# Patient Record
Sex: Female | Born: 1935 | Race: White | Hispanic: No | Marital: Married | Smoking: Never smoker
Health system: Southern US, Community
[De-identification: ages and names within clinical notes are randomized; demographics above are authoritative.]

## PROBLEM LIST (undated history)

## (undated) DIAGNOSIS — I10 Essential (primary) hypertension: Secondary | ICD-10-CM

## (undated) DIAGNOSIS — T7840XA Allergy, unspecified, initial encounter: Secondary | ICD-10-CM

## (undated) DIAGNOSIS — E119 Type 2 diabetes mellitus without complications: Secondary | ICD-10-CM

## (undated) HISTORY — DX: Essential (primary) hypertension: I10

## (undated) HISTORY — DX: Type 2 diabetes mellitus without complications: E11.9

## (undated) HISTORY — DX: Allergy, unspecified, initial encounter: T78.40XA

---

## 1998-03-12 ENCOUNTER — Other Ambulatory Visit: Admission: RE | Admit: 1998-03-12 | Discharge: 1998-03-12 | Payer: Self-pay | Admitting: Obstetrics and Gynecology

## 1999-04-24 ENCOUNTER — Other Ambulatory Visit: Admission: RE | Admit: 1999-04-24 | Discharge: 1999-04-24 | Payer: Self-pay | Admitting: Obstetrics and Gynecology

## 2000-05-18 ENCOUNTER — Other Ambulatory Visit: Admission: RE | Admit: 2000-05-18 | Discharge: 2000-05-18 | Payer: Self-pay | Admitting: Obstetrics and Gynecology

## 2000-10-19 ENCOUNTER — Encounter (INDEPENDENT_AMBULATORY_CARE_PROVIDER_SITE_OTHER): Payer: Self-pay

## 2000-10-19 ENCOUNTER — Ambulatory Visit (HOSPITAL_COMMUNITY): Admission: RE | Admit: 2000-10-19 | Discharge: 2000-10-19 | Payer: Self-pay | Admitting: Gastroenterology

## 2000-10-19 DIAGNOSIS — K5289 Other specified noninfective gastroenteritis and colitis: Secondary | ICD-10-CM

## 2000-10-19 DIAGNOSIS — K5730 Diverticulosis of large intestine without perforation or abscess without bleeding: Secondary | ICD-10-CM | POA: Insufficient documentation

## 2001-06-21 ENCOUNTER — Other Ambulatory Visit: Admission: RE | Admit: 2001-06-21 | Discharge: 2001-06-21 | Payer: Self-pay | Admitting: Obstetrics and Gynecology

## 2002-06-22 ENCOUNTER — Other Ambulatory Visit: Admission: RE | Admit: 2002-06-22 | Discharge: 2002-06-22 | Payer: Self-pay | Admitting: Internal Medicine

## 2004-07-03 ENCOUNTER — Other Ambulatory Visit: Admission: RE | Admit: 2004-07-03 | Discharge: 2004-07-03 | Payer: Self-pay | Admitting: Obstetrics and Gynecology

## 2005-02-12 ENCOUNTER — Ambulatory Visit: Payer: Self-pay | Admitting: Gastroenterology

## 2005-02-21 ENCOUNTER — Encounter (INDEPENDENT_AMBULATORY_CARE_PROVIDER_SITE_OTHER): Payer: Self-pay | Admitting: *Deleted

## 2005-02-21 ENCOUNTER — Ambulatory Visit: Payer: Self-pay | Admitting: Gastroenterology

## 2005-03-21 ENCOUNTER — Ambulatory Visit: Payer: Self-pay | Admitting: Gastroenterology

## 2007-01-05 ENCOUNTER — Ambulatory Visit: Payer: Self-pay | Admitting: Gastroenterology

## 2007-01-05 LAB — CONVERTED CEMR LAB
Basophils Relative: 0.8 % (ref 0.0–1.0)
Bilirubin, Direct: 0.1 mg/dL (ref 0.0–0.3)
CO2: 29 meq/L (ref 19–32)
Eosinophils Relative: 2.5 % (ref 0.0–5.0)
GFR calc Af Amer: 104 mL/min
Glucose, Bld: 116 mg/dL — ABNORMAL HIGH (ref 70–99)
Hemoglobin: 14 g/dL (ref 12.0–15.0)
Lymphocytes Relative: 34.7 % (ref 12.0–46.0)
MCV: 88.3 fL (ref 78.0–100.0)
Monocytes Absolute: 0.6 10*3/uL (ref 0.2–0.7)
Neutro Abs: 4.5 10*3/uL (ref 1.4–7.7)
Potassium: 3.7 meq/L (ref 3.5–5.1)
Sed Rate: 14 mm/hr (ref 0–25)
Sodium: 141 meq/L (ref 135–145)
TSH: 1.88 microintl units/mL (ref 0.35–5.50)
Total Protein: 6.8 g/dL (ref 6.0–8.3)
WBC: 8.3 10*3/uL (ref 4.5–10.5)

## 2007-01-06 ENCOUNTER — Ambulatory Visit: Payer: Self-pay | Admitting: Internal Medicine

## 2007-01-09 ENCOUNTER — Encounter: Admission: RE | Admit: 2007-01-09 | Discharge: 2007-01-09 | Payer: Self-pay | Admitting: Gastroenterology

## 2007-01-20 ENCOUNTER — Ambulatory Visit: Payer: Self-pay | Admitting: Gastroenterology

## 2007-10-04 DIAGNOSIS — E7800 Pure hypercholesterolemia, unspecified: Secondary | ICD-10-CM | POA: Insufficient documentation

## 2007-10-04 DIAGNOSIS — I10 Essential (primary) hypertension: Secondary | ICD-10-CM | POA: Insufficient documentation

## 2007-10-04 DIAGNOSIS — E039 Hypothyroidism, unspecified: Secondary | ICD-10-CM | POA: Insufficient documentation

## 2007-10-04 DIAGNOSIS — K219 Gastro-esophageal reflux disease without esophagitis: Secondary | ICD-10-CM | POA: Insufficient documentation

## 2007-10-04 DIAGNOSIS — I480 Paroxysmal atrial fibrillation: Secondary | ICD-10-CM

## 2007-10-04 DIAGNOSIS — T7840XA Allergy, unspecified, initial encounter: Secondary | ICD-10-CM | POA: Insufficient documentation

## 2008-07-09 ENCOUNTER — Emergency Department (HOSPITAL_COMMUNITY): Admission: EM | Admit: 2008-07-09 | Discharge: 2008-07-09 | Payer: Self-pay | Admitting: Family Medicine

## 2009-09-26 ENCOUNTER — Telehealth: Payer: Self-pay | Admitting: Gastroenterology

## 2010-07-23 NOTE — Progress Notes (Signed)
Summary: triage  Phone Note Call from Patient Call back at Home Phone [phone]   Caller: Patient Call For: Russella Dar Reason for Call: Talk to Nurse Summary of Call: Patient states that she is having problems with hemorroids wants to know if she needs a colon or what can she do. Initial call taken by: Tawni Levy,  September 26, 2009 9:45 AM  Follow-up for Phone Call        Patient  wanted Dr Russella Dar to operate on her bleeding hemorrhoids, I did explain to her that Dr Russella Dar is not a Careers adviser and that he doesn't perform that procedure.  Her primary care is going to refer her to [facility]. She also inquired about her next colon date.  According to the last colonoscopy from 02/2005 she is due for a repeat colon 02/2013.  Patient  will call back for further problems, questions or concerns. Follow-up by: Darcey Nora RN, CGRN,  September 26, 2009 10:00 AM

## 2010-11-05 NOTE — Assessment & Plan Note (Signed)
[facility]                         GASTROENTEROLOGY OFFICE NOTE   Tabitha Bryant                     MRN:          [id number]  DATE:01/05/2007                            DOB:          04/16/1928    Tabitha Bryant is a 77 year old white female patient of Dr. Gerlene Burdock Tisovec  and Dr. Russella Dar.  I was asked to see her today because of 3-4 days of  rather severe right lower quadrant pain radiating straight through to  her back with mild constipation, gas, and bloating.   Tabitha Bryant has had several colonoscopy exams by Dr. Russella Dar for recurrent  abdominal pain and guaiac positive stools.  The last exam was February 21, 2005, we show diverticulosis.  She had been doing well on a high  fiber diet except for some mild constipation.  Several days ago she had  the sudden development of right lower quadrant pain with nausea but no  vomiting, fever, or chills.  She had not taken any unusual medications  and there were no other sick family members at home.  She has been  taking magnesium citrate for constipation.  She denies any hepatobiliary  complaints and has had previous negative gallbladder ultrasound exam.   PAST MEDICAL HISTORY:  1. Hypothyroidism.  2. Hyperlipidemia.  3. Hypertension.  4. Chronic thyroid dysfunction.  5. She also in the past has a history of paroxysmal atrial      fibrillation and has recently been evaluated apparently by Dr.      Alanda Amass.   She is a nonsmoker and denies alcohol abuse.  SHE DOES HAVE A HISTORY OF  PENICILLIN AND CODEINE ALLERGIES.   PREVIOUS SURGICAL PROCEDURES:  1. Appendectomy.  2. Partial hysterectomy.   MEDICATIONS:  1. Synthroid 88 mcg a day.  2. HCTZ 12.5 mg three times a week.  3. Aspirin 81 mg a day.  4. Lanoxin 0.125 mg a day.  5. Altace 10 mg a day.  6. Lipitor 40 mg a day.   EXAMINATION:  She is a healthy, nontoxic appearing white female,  appearing her stated age in no acute distress.  She weighs  184 pounds  and blood pressure is 164/80, pulse was 70 and regular.  Could not  appreciate stigmata of chronic liver disease.  ABDOMINAL:  She had no organomegaly, masses, or distention.  There was  mild tenderness to direct palpation in the right lower quadrant without  rebound.  Bowel sounds were normal.  There was no psoas or obturator  signs present.  RECTAL:  Shows soft, profuse stool in the rectal vault that was guaiac  negative.  MENTAL STATUS:  Clear.   ASSESSMENT:  I think Tabitha Bryant most likely has diverticulitis and  right lower quadrant presentation is probably related to angulation of  her sigmoid colon.  She may have some adhesions from her prior pelvic  surgery.  It certainly does not sound like she has ischemic bowel  difficulties.   RECOMMENDATIONS:  1. Check CBC, sed rate, metabolic profile.  2. Abdominal - pelvic CT scan.  3. Start Cipro 500 mg twice a day along with  metronidazole 500 mg      twice a day for 10 days.  4. P.r.n. magnesium as needed for constipation.  5. Darvocet N-100 every 6-8 hours as needed for pain.  6. GI followup 2-3 week's time or sooner depending on x-rays and      clinical course.     Vania Rea. Jarold Motto, MD, Caleen Essex, FAGA  Electronically Signed    DRP/MedQ  DD: 01/05/2007  DT: 01/06/2007  Job #: [id number]   cc:   Venita Lick. Russella Dar, MD, Lauro Franklin, M.D.

## 2010-11-05 NOTE — Assessment & Plan Note (Signed)
[facility]                         GASTROENTEROLOGY OFFICE NOTE   Tabitha Bryant, Tabitha Bryant                     MRN:          [id number]  DATE:01/20/2007                            DOB:          Mar 25, 1928    This is a return office visit for right lower quadrant pain associated  with mild constipation, gas and bloating.  She was seen by Dr. Jarold Motto  on January 05, 2007.  I have reviewed his notes and the CT scan and blood  work obtained at that visit.  Due to the findings on CT scan, an  abdominal MRI with and without contrast was performed on January 09, 2007.  The hepatic lesions were both felt to be simple cysts.  Marked fatty  infiltration of the liver was noted.  L2-L3 central canal stenosis was  also noted.  She was treated with a course of Cipro and Flagyl for 10  days, as well as Milk of Magnesia for constipation and her symptoms  resolved.  There was no clear evidence on the CT scan or MRI of  diverticulitis.  She last underwent colonoscopy in September 2006 which  showed left colon diverticulosis and patchy erythema on the ileocecal  valve.  The ileocecal valve biopsy showed a nonspecific mild active  colitis.   CURRENT MEDICATIONS:  Listed on the chart, updated and reviewed.   MEDICATION ALLERGIES:  PENICILLIN and CODEINE.   PHYSICAL EXAMINATION:  Overweight white female in no acute distress.  Weight 182 pounds.  Blood pressure is 126/68, pulse 56 and regular.  CHEST:  Clear to auscultation bilaterally.  CARDIAC:  Regular rate and rhythm without murmurs.  ABDOMEN:  Soft, nontender, non-distended.  Normoactive bowel sounds.  No  palpable organomegaly, masses or hernias.  RECTAL:  Examination deferred; recent exam by Dr. Jarold Motto showed no  lesions and Hemoccult-negative stool.   ASSESSMENT AND PLAN:  Right lower quadrant associated with gas and  bloating.  Etiology of the symptoms are not clear, but are likely  secondary to constipation.   She may have had mild diverticulitis that  was not noted on imaging studies and has resolved with antibiotics.  She  is to maintain a long-term high-fiber diet with adequate fluid intake.  She may use a mild laxative such as Milk of Magnesia or MiraLax on an as-  needed basis.  If she has further symptoms, will consider colonoscopy  for further evaluation and to reevaluate the nonspecific mild colitis  noted in the right colon.     Venita Lick. Russella Dar, MD, [facility]  Electronically Signed    MTS/MedQ  DD: 01/21/2007  DT: 01/22/2007  Job #: [id number]   cc:   Gaspar Garbe, M.D.

## 2013-01-26 ENCOUNTER — Encounter: Payer: Self-pay | Admitting: Gastroenterology

## 2014-01-03 ENCOUNTER — Ambulatory Visit: Payer: Self-pay

## 2014-01-03 ENCOUNTER — Ambulatory Visit (INDEPENDENT_AMBULATORY_CARE_PROVIDER_SITE_OTHER): Payer: Medicare Other

## 2014-01-03 ENCOUNTER — Ambulatory Visit (INDEPENDENT_AMBULATORY_CARE_PROVIDER_SITE_OTHER): Payer: Medicare Other | Admitting: Podiatry

## 2014-01-03 ENCOUNTER — Encounter: Payer: Self-pay | Admitting: Podiatry

## 2014-01-03 VITALS — BP 159/76 | HR 78 | Resp 12

## 2014-01-03 DIAGNOSIS — R52 Pain, unspecified: Secondary | ICD-10-CM

## 2014-01-03 DIAGNOSIS — M722 Plantar fascial fibromatosis: Secondary | ICD-10-CM

## 2014-01-03 NOTE — Progress Notes (Signed)
   Subjective:    Patient ID: Tabitha Bryant, female    DOB: [birth year], 80 y.o.   MRN: [id number]  HPI  PT STATED LT FOOT HEEL IS BEEN HURTING FOR 2 WEEKS. THE HEEL IS GETTING WORSE, ESPECIALLY EARLY IN THE MORNING. THE FOOT GET AGGRAVATED BY PRESSURE AND EVERY TIME SHOT WITH  CORTISONE MAKING B/P INCREASE. TRIED TO SOAK WITH EPSON SALT BUT NO  HELP.    Review of Systems  HENT: Positive for sinus pressure.   Musculoskeletal: Positive for gait problem.  All other systems reviewed and are negative.      Objective:   Physical Exam: I have reviewed her past medical history medications allergies surgeries social history and review of systems. Pulses are strongly palpable bilateral. Neurologic sensorium is intact per Semmes-Weinstein monofilament. Deep tendon reflexes are intact bilateral muscle strength is 5 over 5 dorsiflexors plantar flexors inverters everters all intrinsic musculature is intact. Orthopedic evaluation demonstrates pain on palpation medial continued tubercle of the left heel. Radiographic evaluation confirms plantar distally oriented calcaneal heel spur with soft tissue increase in density at the plantar fascial calcaneal insertion site. Cutaneous evaluation demonstrates supple well hydrated cutis no erythema edema saline is drainage or odor.        Assessment & Plan:  Assessment: Plantar fasciitis left heel.  Plan: Injected left heel today with Kenalog and local anesthetic dispensed a night splint discussed appropriate shoe gear stretching exercises ice therapy and shoe gear modifications.

## 2014-01-18 ENCOUNTER — Ambulatory Visit: Payer: Self-pay | Admitting: Podiatry

## 2014-01-31 ENCOUNTER — Ambulatory Visit: Payer: Medicare Other | Admitting: Podiatry

## 2014-02-02 ENCOUNTER — Ambulatory Visit (INDEPENDENT_AMBULATORY_CARE_PROVIDER_SITE_OTHER): Payer: Medicare Other | Admitting: Podiatry

## 2014-02-02 ENCOUNTER — Encounter: Payer: Self-pay | Admitting: Podiatry

## 2014-02-02 VITALS — BP 108/87 | HR 80 | Resp 12

## 2014-02-02 DIAGNOSIS — M722 Plantar fascial fibromatosis: Secondary | ICD-10-CM

## 2014-02-03 NOTE — Progress Notes (Signed)
She presents today for followup of pain to her left heel. She states it hurts particularly which is walking on cement.  Objective: Vital signs are stable she is alert and oriented x3 she has no erythema edema cellulitis drainage or odor. Pulses are palpable to the left foot. No calf pain. She has pain on palpation medial continued tubercle of the left heel.  Assessment: Pain in limb secondary to plantar fasciitis of the left heel.  Plan: Injected left heel today at the patient's request and she had a plantar fascial strapping applied. I will followup with her in 3-4 weeks.

## 2014-08-22 ENCOUNTER — Encounter: Payer: Self-pay | Admitting: Podiatry

## 2014-08-22 ENCOUNTER — Ambulatory Visit (INDEPENDENT_AMBULATORY_CARE_PROVIDER_SITE_OTHER): Payer: Medicare Other | Admitting: Podiatry

## 2014-08-22 VITALS — BP 144/71 | HR 67 | Resp 16

## 2014-08-22 DIAGNOSIS — M722 Plantar fascial fibromatosis: Secondary | ICD-10-CM | POA: Diagnosis not present

## 2014-08-22 NOTE — Progress Notes (Signed)
She presents today for follow-up of her plantar fasciitis left. She states that the heel started hurting again.  Objective: Vital signs are stable she's alert and oriented 3. Pulses are palpable left foot. No calf pain. She has pain on palpation medial calcaneal tubercle of the left heel.  Assessment: Chronic intractable plantar fasciitis left heel.  Plan: Injected the left heel today with Kenalog and local anesthetic. Plan her any plantar fascial brace and will follow up with her in 1 month.

## 2014-09-05 ENCOUNTER — Encounter: Payer: Medicare Other | Admitting: Podiatry

## 2014-09-07 NOTE — Progress Notes (Signed)
This encounter was created in error - please disregard.

## 2014-09-26 ENCOUNTER — Encounter: Payer: Self-pay | Admitting: Podiatry

## 2014-09-26 ENCOUNTER — Ambulatory Visit (INDEPENDENT_AMBULATORY_CARE_PROVIDER_SITE_OTHER): Payer: Medicare Other | Admitting: Podiatry

## 2014-09-26 VITALS — BP 136/50 | HR 97 | Resp 16

## 2014-09-26 DIAGNOSIS — M76822 Posterior tibial tendinitis, left leg: Secondary | ICD-10-CM

## 2014-09-26 DIAGNOSIS — M722 Plantar fascial fibromatosis: Secondary | ICD-10-CM | POA: Diagnosis not present

## 2014-09-26 NOTE — Progress Notes (Signed)
She presents today for follow-up of her plantar fasciitis. She states that the heel seems to be better but it hurts in here patient points to the posterior tibial tendon of the left foot.  Objective: Pulses remain palpable and there are no calf pain. She has pain on inversion against resistance and pain on palpation of the posterior tibial tendon as it courses beneath the medial malleolus extending to the level of the navicular tuberosity. There is mild edema and mild fluctuance in the tendon sheath.  Assessment: Posterior tibial tendinitis probably associated with plantar fasciitis and lateral compensatory syndrome.  Plan: Injected the tendon sheath today with dexamethasone and local anesthetic she's going to continue all conservative therapies I will follow-up with her in 1 month at which time we'll discuss the possible need for orthotics.

## 2016-02-26 ENCOUNTER — Encounter (HOSPITAL_COMMUNITY): Admission: EM | Disposition: E | Payer: Self-pay | Source: Home / Self Care

## 2016-02-26 ENCOUNTER — Emergency Department (HOSPITAL_COMMUNITY): Payer: Medicare Other

## 2016-02-26 ENCOUNTER — Inpatient Hospital Stay (HOSPITAL_COMMUNITY)
Admission: EM | Admit: 2016-02-26 | Discharge: 2016-03-23 | DRG: 329 | Disposition: E | Payer: Medicare Other | Attending: General Surgery | Admitting: General Surgery

## 2016-02-26 ENCOUNTER — Inpatient Hospital Stay (HOSPITAL_COMMUNITY): Payer: Medicare Other

## 2016-02-26 ENCOUNTER — Inpatient Hospital Stay (HOSPITAL_COMMUNITY): Payer: Medicare Other | Admitting: Anesthesiology

## 2016-02-26 ENCOUNTER — Encounter (HOSPITAL_COMMUNITY): Payer: Self-pay | Admitting: Emergency Medicine

## 2016-02-26 DIAGNOSIS — K922 Gastrointestinal hemorrhage, unspecified: Secondary | ICD-10-CM

## 2016-02-26 DIAGNOSIS — E875 Hyperkalemia: Secondary | ICD-10-CM | POA: Diagnosis not present

## 2016-02-26 DIAGNOSIS — S3991XA Unspecified injury of abdomen, initial encounter: Secondary | ICD-10-CM

## 2016-02-26 DIAGNOSIS — E119 Type 2 diabetes mellitus without complications: Secondary | ICD-10-CM

## 2016-02-26 DIAGNOSIS — K659 Peritonitis, unspecified: Secondary | ICD-10-CM | POA: Diagnosis present

## 2016-02-26 DIAGNOSIS — S3981XA Other specified injuries of abdomen, initial encounter: Secondary | ICD-10-CM | POA: Diagnosis not present

## 2016-02-26 DIAGNOSIS — Z9049 Acquired absence of other specified parts of digestive tract: Secondary | ICD-10-CM

## 2016-02-26 DIAGNOSIS — I10 Essential (primary) hypertension: Secondary | ICD-10-CM | POA: Diagnosis present

## 2016-02-26 DIAGNOSIS — I480 Paroxysmal atrial fibrillation: Secondary | ICD-10-CM | POA: Diagnosis not present

## 2016-02-26 DIAGNOSIS — I959 Hypotension, unspecified: Secondary | ICD-10-CM | POA: Diagnosis present

## 2016-02-26 DIAGNOSIS — J90 Pleural effusion, not elsewhere classified: Secondary | ICD-10-CM | POA: Diagnosis present

## 2016-02-26 DIAGNOSIS — I6521 Occlusion and stenosis of right carotid artery: Secondary | ICD-10-CM | POA: Diagnosis present

## 2016-02-26 DIAGNOSIS — M6289 Other specified disorders of muscle: Secondary | ICD-10-CM | POA: Diagnosis not present

## 2016-02-26 DIAGNOSIS — Z66 Do not resuscitate: Secondary | ICD-10-CM | POA: Diagnosis present

## 2016-02-26 DIAGNOSIS — I639 Cerebral infarction, unspecified: Secondary | ICD-10-CM | POA: Diagnosis not present

## 2016-02-26 DIAGNOSIS — Y92410 Unspecified street and highway as the place of occurrence of the external cause: Secondary | ICD-10-CM

## 2016-02-26 DIAGNOSIS — I6350 Cerebral infarction due to unspecified occlusion or stenosis of unspecified cerebral artery: Secondary | ICD-10-CM | POA: Diagnosis not present

## 2016-02-26 DIAGNOSIS — I6302 Cerebral infarction due to thrombosis of basilar artery: Secondary | ICD-10-CM | POA: Diagnosis not present

## 2016-02-26 DIAGNOSIS — G9340 Encephalopathy, unspecified: Secondary | ICD-10-CM | POA: Diagnosis present

## 2016-02-26 DIAGNOSIS — R0602 Shortness of breath: Secondary | ICD-10-CM

## 2016-02-26 DIAGNOSIS — S42001A Fracture of unspecified part of right clavicle, initial encounter for closed fracture: Secondary | ICD-10-CM | POA: Diagnosis present

## 2016-02-26 DIAGNOSIS — E876 Hypokalemia: Secondary | ICD-10-CM | POA: Diagnosis present

## 2016-02-26 DIAGNOSIS — E872 Acidosis: Secondary | ICD-10-CM | POA: Diagnosis present

## 2016-02-26 DIAGNOSIS — K631 Perforation of intestine (nontraumatic): Secondary | ICD-10-CM | POA: Diagnosis present

## 2016-02-26 DIAGNOSIS — K259 Gastric ulcer, unspecified as acute or chronic, without hemorrhage or perforation: Secondary | ICD-10-CM

## 2016-02-26 DIAGNOSIS — D62 Acute posthemorrhagic anemia: Secondary | ICD-10-CM | POA: Diagnosis not present

## 2016-02-26 DIAGNOSIS — S52502A Unspecified fracture of the lower end of left radius, initial encounter for closed fracture: Secondary | ICD-10-CM | POA: Diagnosis present

## 2016-02-26 DIAGNOSIS — S299XXA Unspecified injury of thorax, initial encounter: Secondary | ICD-10-CM

## 2016-02-26 DIAGNOSIS — I6330 Cerebral infarction due to thrombosis of unspecified cerebral artery: Secondary | ICD-10-CM | POA: Diagnosis not present

## 2016-02-26 DIAGNOSIS — R7881 Bacteremia: Secondary | ICD-10-CM

## 2016-02-26 DIAGNOSIS — G8191 Hemiplegia, unspecified affecting right dominant side: Secondary | ICD-10-CM | POA: Diagnosis present

## 2016-02-26 DIAGNOSIS — R000 Tachycardia, unspecified: Secondary | ICD-10-CM | POA: Diagnosis present

## 2016-02-26 DIAGNOSIS — Z7982 Long term (current) use of aspirin: Secondary | ICD-10-CM

## 2016-02-26 DIAGNOSIS — I495 Sick sinus syndrome: Secondary | ICD-10-CM | POA: Diagnosis not present

## 2016-02-26 DIAGNOSIS — E669 Obesity, unspecified: Secondary | ICD-10-CM | POA: Diagnosis present

## 2016-02-26 DIAGNOSIS — S36892A Contusion of other intra-abdominal organs, initial encounter: Secondary | ICD-10-CM | POA: Diagnosis present

## 2016-02-26 DIAGNOSIS — E1165 Type 2 diabetes mellitus with hyperglycemia: Secondary | ICD-10-CM | POA: Diagnosis present

## 2016-02-26 DIAGNOSIS — G459 Transient cerebral ischemic attack, unspecified: Secondary | ICD-10-CM | POA: Diagnosis not present

## 2016-02-26 DIAGNOSIS — R4701 Aphasia: Secondary | ICD-10-CM | POA: Diagnosis present

## 2016-02-26 DIAGNOSIS — E785 Hyperlipidemia, unspecified: Secondary | ICD-10-CM | POA: Diagnosis present

## 2016-02-26 DIAGNOSIS — I6789 Other cerebrovascular disease: Secondary | ICD-10-CM | POA: Diagnosis not present

## 2016-02-26 DIAGNOSIS — E870 Hyperosmolality and hypernatremia: Secondary | ICD-10-CM | POA: Diagnosis present

## 2016-02-26 DIAGNOSIS — I63112 Cerebral infarction due to embolism of left vertebral artery: Secondary | ICD-10-CM | POA: Diagnosis not present

## 2016-02-26 DIAGNOSIS — R0682 Tachypnea, not elsewhere classified: Secondary | ICD-10-CM | POA: Diagnosis present

## 2016-02-26 DIAGNOSIS — J9601 Acute respiratory failure with hypoxia: Secondary | ICD-10-CM | POA: Diagnosis not present

## 2016-02-26 DIAGNOSIS — B9562 Methicillin resistant Staphylococcus aureus infection as the cause of diseases classified elsewhere: Secondary | ICD-10-CM | POA: Diagnosis present

## 2016-02-26 DIAGNOSIS — Z6830 Body mass index (BMI) 30.0-30.9, adult: Secondary | ICD-10-CM

## 2016-02-26 DIAGNOSIS — S298XXA Other specified injuries of thorax, initial encounter: Secondary | ICD-10-CM

## 2016-02-26 DIAGNOSIS — Z880 Allergy status to penicillin: Secondary | ICD-10-CM

## 2016-02-26 DIAGNOSIS — I63133 Cerebral infarction due to embolism of bilateral carotid arteries: Secondary | ICD-10-CM | POA: Diagnosis not present

## 2016-02-26 DIAGNOSIS — H518 Other specified disorders of binocular movement: Secondary | ICD-10-CM | POA: Diagnosis present

## 2016-02-26 DIAGNOSIS — S2231XA Fracture of one rib, right side, initial encounter for closed fracture: Secondary | ICD-10-CM | POA: Diagnosis not present

## 2016-02-26 DIAGNOSIS — J9600 Acute respiratory failure, unspecified whether with hypoxia or hypercapnia: Secondary | ICD-10-CM | POA: Diagnosis not present

## 2016-02-26 DIAGNOSIS — R001 Bradycardia, unspecified: Secondary | ICD-10-CM | POA: Diagnosis not present

## 2016-02-26 DIAGNOSIS — I4891 Unspecified atrial fibrillation: Secondary | ICD-10-CM | POA: Diagnosis not present

## 2016-02-26 DIAGNOSIS — S2239XA Fracture of one rib, unspecified side, initial encounter for closed fracture: Secondary | ICD-10-CM | POA: Diagnosis present

## 2016-02-26 DIAGNOSIS — S2242XA Multiple fractures of ribs, left side, initial encounter for closed fracture: Secondary | ICD-10-CM | POA: Diagnosis present

## 2016-02-26 DIAGNOSIS — J9811 Atelectasis: Secondary | ICD-10-CM

## 2016-02-26 DIAGNOSIS — N179 Acute kidney failure, unspecified: Secondary | ICD-10-CM | POA: Diagnosis not present

## 2016-02-26 DIAGNOSIS — J9690 Respiratory failure, unspecified, unspecified whether with hypoxia or hypercapnia: Secondary | ICD-10-CM

## 2016-02-26 DIAGNOSIS — I38 Endocarditis, valve unspecified: Secondary | ICD-10-CM | POA: Diagnosis present

## 2016-02-26 DIAGNOSIS — Z9289 Personal history of other medical treatment: Secondary | ICD-10-CM

## 2016-02-26 DIAGNOSIS — K668 Other specified disorders of peritoneum: Secondary | ICD-10-CM | POA: Diagnosis present

## 2016-02-26 DIAGNOSIS — S2249XA Multiple fractures of ribs, unspecified side, initial encounter for closed fracture: Secondary | ICD-10-CM | POA: Diagnosis present

## 2016-02-26 DIAGNOSIS — Z515 Encounter for palliative care: Secondary | ICD-10-CM | POA: Diagnosis present

## 2016-02-26 DIAGNOSIS — R4182 Altered mental status, unspecified: Secondary | ICD-10-CM

## 2016-02-26 HISTORY — PX: OMENTECTOMY: SHX5985

## 2016-02-26 HISTORY — PX: LAPAROTOMY: SHX154

## 2016-02-26 HISTORY — PX: BOWEL RESECTION: SHX1257

## 2016-02-26 LAB — BLOOD GAS, ARTERIAL
ACID-BASE DEFICIT: 9.2 mmol/L — AB (ref 0.0–2.0)
BICARBONATE: 16.5 mmol/L — AB (ref 20.0–28.0)
DRAWN BY: 44898
FIO2: 50
LHR: 16 {breaths}/min
MECHVT: 500 mL
O2 SAT: 97.9 %
PATIENT TEMPERATURE: 98.6
PCO2 ART: 37.5 mmHg (ref 32.0–48.0)
PEEP/CPAP: 5 cmH2O
PH ART: 7.265 — AB (ref 7.350–7.450)
PO2 ART: 116 mmHg — AB (ref 83.0–108.0)

## 2016-02-26 LAB — CBC
HCT: 42.5 % (ref 36.0–46.0)
HEMATOCRIT: 33.4 % — AB (ref 36.0–46.0)
HEMOGLOBIN: 11 g/dL — AB (ref 12.0–15.0)
Hemoglobin: 13.4 g/dL (ref 12.0–15.0)
MCH: 28.8 pg (ref 26.0–34.0)
MCH: 29.7 pg (ref 26.0–34.0)
MCHC: 31.5 g/dL (ref 30.0–36.0)
MCHC: 32.9 g/dL (ref 30.0–36.0)
MCV: 91.2 fL (ref 78.0–100.0)
MCV: 90.3 fL (ref 78.0–100.0)
PLATELETS: 311 10*3/uL (ref 150–400)
Platelets: 193 10*3/uL (ref 150–400)
RBC: 4.66 MIL/uL (ref 3.87–5.11)
RBC: 3.7 MIL/uL — ABNORMAL LOW (ref 3.87–5.11)
RDW: 14.5 % (ref 11.5–15.5)
RDW: 14.1 % (ref 11.5–15.5)
WBC: 27.7 10*3/uL — AB (ref 4.0–10.5)
WBC: 9.3 10*3/uL (ref 4.0–10.5)

## 2016-02-26 LAB — I-STAT CG4 LACTIC ACID, ED: Lactic Acid, Venous: 5.87 mmol/L (ref 0.5–1.9)

## 2016-02-26 LAB — URINALYSIS, ROUTINE W REFLEX MICROSCOPIC
BILIRUBIN URINE: NEGATIVE
Glucose, UA: 250 mg/dL — AB
KETONES UR: NEGATIVE mg/dL
Leukocytes, UA: NEGATIVE
NITRITE: NEGATIVE
PH: 7 (ref 5.0–8.0)
Protein, ur: 30 mg/dL — AB
Specific Gravity, Urine: 1.013 (ref 1.005–1.030)

## 2016-02-26 LAB — COMPREHENSIVE METABOLIC PANEL
ALK PHOS: 63 U/L (ref 38–126)
ALT: 99 U/L — AB (ref 14–54)
AST: 123 U/L — ABNORMAL HIGH (ref 15–41)
Albumin: 3.7 g/dL (ref 3.5–5.0)
Anion gap: 11 (ref 5–15)
BILIRUBIN TOTAL: 0.6 mg/dL (ref 0.3–1.2)
BUN: 11 mg/dL (ref 6–20)
CALCIUM: 9.7 mg/dL (ref 8.9–10.3)
CO2: 19 mmol/L — ABNORMAL LOW (ref 22–32)
CREATININE: 1.18 mg/dL — AB (ref 0.44–1.00)
Chloride: 106 mmol/L (ref 101–111)
GFR, EST AFRICAN AMERICAN: 47 mL/min — AB (ref >60)
GFR, EST NON AFRICAN AMERICAN: 41 mL/min — AB (ref >60)
Glucose, Bld: 285 mg/dL — ABNORMAL HIGH (ref 65–99)
Potassium: 4.2 mmol/L (ref 3.5–5.1)
Sodium: 136 mmol/L (ref 135–145)
TOTAL PROTEIN: 5.9 g/dL — AB (ref 6.5–8.1)

## 2016-02-26 LAB — BASIC METABOLIC PANEL
Anion gap: 7 (ref 5–15)
BUN: 14 mg/dL (ref 6–20)
CHLORIDE: 114 mmol/L — AB (ref 101–111)
CO2: 17 mmol/L — AB (ref 22–32)
Calcium: 7.4 mg/dL — ABNORMAL LOW (ref 8.9–10.3)
Creatinine, Ser: 1.25 mg/dL — ABNORMAL HIGH (ref 0.44–1.00)
GFR calc non Af Amer: 38 mL/min — ABNORMAL LOW (ref >60)
GFR, EST AFRICAN AMERICAN: 44 mL/min — AB (ref >60)
GLUCOSE: 288 mg/dL — AB (ref 65–99)
Potassium: 4 mmol/L (ref 3.5–5.1)
Sodium: 138 mmol/L (ref 135–145)

## 2016-02-26 LAB — I-STAT CHEM 8, ED
BUN: 13 mg/dL (ref 6–20)
CALCIUM ION: 1.22 mmol/L (ref 1.15–1.40)
Chloride: 105 mmol/L (ref 101–111)
Creatinine, Ser: 1.1 mg/dL — ABNORMAL HIGH (ref 0.44–1.00)
Glucose, Bld: 276 mg/dL — ABNORMAL HIGH (ref 65–99)
HCT: 42 % (ref 36.0–46.0)
Hemoglobin: 14.3 g/dL (ref 12.0–15.0)
Potassium: 4.2 mmol/L (ref 3.5–5.1)
Sodium: 139 mmol/L (ref 135–145)
TCO2: 22 mmol/L (ref 0–100)

## 2016-02-26 LAB — POCT I-STAT 7, (LYTES, BLD GAS, ICA,H+H)
Acid-base deficit: 9 mmol/L — ABNORMAL HIGH (ref 0.0–2.0)
BICARBONATE: 18.8 mmol/L — AB (ref 20.0–28.0)
Calcium, Ion: 1.02 mmol/L — ABNORMAL LOW (ref 1.15–1.40)
HEMATOCRIT: 26 % — AB (ref 36.0–46.0)
Hemoglobin: 8.8 g/dL — ABNORMAL LOW (ref 12.0–15.0)
O2 Saturation: 100 %
PCO2 ART: 44 mmHg (ref 32.0–48.0)
PO2 ART: 411 mmHg — AB (ref 83.0–108.0)
POTASSIUM: 4.3 mmol/L (ref 3.5–5.1)
Patient temperature: 35
Sodium: 139 mmol/L (ref 135–145)
TCO2: 20 mmol/L (ref 0–100)
pH, Arterial: 7.228 — ABNORMAL LOW (ref 7.350–7.450)

## 2016-02-26 LAB — PROTIME-INR
INR: 1.08
PROTHROMBIN TIME: 14 s (ref 11.4–15.2)

## 2016-02-26 LAB — URINE MICROSCOPIC-ADD ON

## 2016-02-26 LAB — ABO/RH: ABO/RH(D): AB POS

## 2016-02-26 LAB — CBG MONITORING, ED: Glucose-Capillary: 255 mg/dL — ABNORMAL HIGH (ref 65–99)

## 2016-02-26 LAB — PREPARE RBC (CROSSMATCH)

## 2016-02-26 LAB — TRIGLYCERIDES: Triglycerides: 206 mg/dL — ABNORMAL HIGH (ref <150)

## 2016-02-26 LAB — MRSA PCR SCREENING: MRSA BY PCR: NEGATIVE

## 2016-02-26 LAB — ETHANOL

## 2016-02-26 SURGERY — LAPAROTOMY, EXPLORATORY
Anesthesia: General | Site: Abdomen

## 2016-02-26 MED ORDER — SODIUM CHLORIDE 0.9 % IV SOLN: Freq: Once | INTRAVENOUS | Status: DC

## 2016-02-26 MED ORDER — MIDAZOLAM HCL 2 MG/2ML IJ SOLN
INTRAMUSCULAR | Status: AC
  Filled 2016-02-26: qty 2

## 2016-02-26 MED ORDER — MIDAZOLAM HCL 5 MG/5ML IJ SOLN
INTRAMUSCULAR | Status: DC | PRN
  Administered 2016-02-26: 2 mg via INTRAVENOUS

## 2016-02-26 MED ORDER — DEXTROSE 5 % IV SOLN
1.0000 g | Freq: Four times a day (QID) | INTRAVENOUS | Status: DC
  Administered 2016-02-26: 1 g via INTRAVENOUS
  Filled 2016-02-26: qty 1

## 2016-02-26 MED ORDER — SUCCINYLCHOLINE CHLORIDE 20 MG/ML IJ SOLN
INTRAMUSCULAR | Status: DC | PRN
  Administered 2016-02-26: 60 mg via INTRAVENOUS

## 2016-02-26 MED ORDER — FENTANYL CITRATE (PF) 100 MCG/2ML IJ SOLN
50.0000 ug | INTRAMUSCULAR | Status: DC | PRN
  Administered 2016-02-27 – 2016-02-28 (×7): 50 ug via INTRAVENOUS
  Filled 2016-02-26 (×8): qty 2

## 2016-02-26 MED ORDER — DIGOXIN 0.25 MG/ML IJ SOLN
0.1250 mg | Freq: Every day | INTRAMUSCULAR | Status: DC
  Administered 2016-02-28 – 2016-03-03 (×5): 0.125 mg via INTRAVENOUS
  Filled 2016-02-26 (×6): qty 2

## 2016-02-26 MED ORDER — LACTATED RINGERS IV SOLN
INTRAVENOUS | Status: DC | PRN
  Administered 2016-02-26: 14:00:00 via INTRAVENOUS

## 2016-02-26 MED ORDER — 0.9 % SODIUM CHLORIDE (POUR BTL) OPTIME
TOPICAL | Status: DC | PRN
  Administered 2016-02-26 (×3): 2000 mL

## 2016-02-26 MED ORDER — LEVOTHYROXINE SODIUM 100 MCG IV SOLR
50.0000 ug | Freq: Every day | INTRAVENOUS | Status: DC
  Administered 2016-02-27 – 2016-03-07 (×10): 50 ug via INTRAVENOUS
  Filled 2016-02-26 (×10): qty 5

## 2016-02-26 MED ORDER — FENTANYL CITRATE (PF) 100 MCG/2ML IJ SOLN: 50.0000 ug | INTRAMUSCULAR | Status: DC | PRN

## 2016-02-26 MED ORDER — PHENYLEPHRINE HCL 10 MG/ML IJ SOLN
0.0000 ug/min | INTRAVENOUS | Status: DC
  Filled 2016-02-26 (×3): qty 1

## 2016-02-26 MED ORDER — PROPOFOL 10 MG/ML IV BOLUS
INTRAVENOUS | Status: AC
  Filled 2016-02-26: qty 20

## 2016-02-26 MED ORDER — SODIUM CHLORIDE 0.9 % IV SOLN
INTRAVENOUS | Status: DC | PRN
  Administered 2016-02-26: 14:00:00 via INTRAVENOUS

## 2016-02-26 MED ORDER — ROCURONIUM BROMIDE 100 MG/10ML IV SOLN
INTRAVENOUS | Status: DC | PRN
  Administered 2016-02-26: 50 mg via INTRAVENOUS

## 2016-02-26 MED ORDER — ONDANSETRON HCL 4 MG/2ML IJ SOLN: 4.0000 mg | Freq: Four times a day (QID) | INTRAMUSCULAR | Status: DC | PRN

## 2016-02-26 MED ORDER — PHENYLEPHRINE HCL 10 MG/ML IJ SOLN
INTRAMUSCULAR | Status: DC | PRN
  Administered 2016-02-26 (×2): 80 ug via INTRAVENOUS
  Administered 2016-02-26: 40 ug via INTRAVENOUS

## 2016-02-26 MED ORDER — ONDANSETRON HCL 4 MG/2ML IJ SOLN
4.0000 mg | Freq: Once | INTRAMUSCULAR | Status: AC
  Administered 2016-02-26: 4 mg via INTRAVENOUS
  Filled 2016-02-26: qty 2

## 2016-02-26 MED ORDER — FENTANYL CITRATE (PF) 100 MCG/2ML IJ SOLN
INTRAMUSCULAR | Status: AC
  Filled 2016-02-26: qty 2

## 2016-02-26 MED ORDER — HYDROMORPHONE HCL 1 MG/ML IJ SOLN
0.5000 mg | Freq: Once | INTRAMUSCULAR | Status: DC
  Filled 2016-02-26: qty 1

## 2016-02-26 MED ORDER — FAMOTIDINE IN NACL 20-0.9 MG/50ML-% IV SOLN
20.0000 mg | INTRAVENOUS | Status: DC
  Administered 2016-02-27 – 2016-03-01 (×4): 20 mg via INTRAVENOUS
  Filled 2016-02-26 (×4): qty 50

## 2016-02-26 MED ORDER — PROPOFOL 10 MG/ML IV BOLUS
INTRAVENOUS | Status: DC | PRN
  Administered 2016-02-26: 100 mg via INTRAVENOUS

## 2016-02-26 MED ORDER — FENTANYL CITRATE (PF) 100 MCG/2ML IJ SOLN
50.0000 ug | Freq: Once | INTRAMUSCULAR | Status: AC
  Administered 2016-02-26: 50 ug via INTRAVENOUS
  Filled 2016-02-26: qty 2

## 2016-02-26 MED ORDER — KCL IN DEXTROSE-NACL 20-5-0.45 MEQ/L-%-% IV SOLN
INTRAVENOUS | Status: DC
  Administered 2016-02-26 – 2016-02-27 (×2): via INTRAVENOUS
  Filled 2016-02-26 (×3): qty 1000

## 2016-02-26 MED ORDER — FENTANYL CITRATE (PF) 100 MCG/2ML IJ SOLN
INTRAMUSCULAR | Status: DC | PRN
  Administered 2016-02-26 (×3): 100 ug via INTRAVENOUS

## 2016-02-26 MED ORDER — SODIUM CHLORIDE 0.9 % IV SOLN
INTRAVENOUS | Status: AC | PRN
  Administered 2016-02-26: 999 mL/h via INTRAVENOUS

## 2016-02-26 MED ORDER — SODIUM CHLORIDE 0.9 % IV BOLUS (SEPSIS)
2000.0000 mL | Freq: Once | INTRAVENOUS | Status: AC
  Administered 2016-02-26: 2000 mL via INTRAVENOUS

## 2016-02-26 MED ORDER — IOPAMIDOL (ISOVUE-300) INJECTION 61%
INTRAVENOUS | Status: AC
  Administered 2016-02-26: 75 mL
  Filled 2016-02-26: qty 75

## 2016-02-26 MED ORDER — ONDANSETRON HCL 4 MG PO TABS: 4.0000 mg | ORAL_TABLET | Freq: Four times a day (QID) | ORAL | Status: DC | PRN

## 2016-02-26 MED ORDER — SODIUM CHLORIDE 0.9 % IV BOLUS (SEPSIS): 500.0000 mL | Freq: Once | INTRAVENOUS | Status: DC

## 2016-02-26 MED ORDER — LIDOCAINE HCL (CARDIAC) 20 MG/ML IV SOLN
INTRAVENOUS | Status: DC | PRN
  Administered 2016-02-26: 50 mg via INTRAVENOUS

## 2016-02-26 MED ORDER — PHENYLEPHRINE HCL 10 MG/ML IJ SOLN
INTRAVENOUS | Status: DC | PRN
  Administered 2016-02-26: 50 ug/min via INTRAVENOUS

## 2016-02-26 MED ORDER — PROPOFOL 1000 MG/100ML IV EMUL
0.0000 ug/kg/min | INTRAVENOUS | Status: DC
  Administered 2016-02-26: 20 ug/kg/min via INTRAVENOUS
  Administered 2016-02-27: 23 ug/kg/min via INTRAVENOUS
  Administered 2016-02-27: 22 ug/kg/min via INTRAVENOUS
  Filled 2016-02-26: qty 100

## 2016-02-26 MED ORDER — LOTEPREDNOL ETABONATE 0.5 % OP SUSP
1.0000 [drp] | Freq: Four times a day (QID) | OPHTHALMIC | Status: DC
  Administered 2016-02-26 – 2016-03-07 (×40): 1 [drp] via OPHTHALMIC
  Filled 2016-02-26: qty 5

## 2016-02-26 SURGICAL SUPPLY — 45 items
BLADE SURG ROTATE 9660 (MISCELLANEOUS) IMPLANT
CANISTER SUCTION 2500CC (MISCELLANEOUS) ×4 IMPLANT
CHLORAPREP W/TINT 26ML (MISCELLANEOUS) ×4 IMPLANT
COVER SURGICAL LIGHT HANDLE (MISCELLANEOUS) ×8 IMPLANT
DRAPE LAPAROSCOPIC ABDOMINAL (DRAPES) ×4 IMPLANT
DRAPE WARM FLUID 44X44 (DRAPE) ×4 IMPLANT
DRSG OPSITE POSTOP 4X10 (GAUZE/BANDAGES/DRESSINGS) IMPLANT
DRSG OPSITE POSTOP 4X8 (GAUZE/BANDAGES/DRESSINGS) IMPLANT
DRSG PAD ABDOMINAL 8X10 ST (GAUZE/BANDAGES/DRESSINGS) ×4 IMPLANT
ELECT BLADE 6.5 EXT (BLADE) ×4 IMPLANT
ELECT CAUTERY BLADE 6.4 (BLADE) ×4 IMPLANT
ELECT REM PT RETURN 9FT ADLT (ELECTROSURGICAL) ×4
ELECTRODE REM PT RTRN 9FT ADLT (ELECTROSURGICAL) ×2 IMPLANT
GLOVE BIO SURGEON STRL SZ8 (GLOVE) ×4 IMPLANT
GLOVE BIOGEL PI IND STRL 8 (GLOVE) ×2 IMPLANT
GLOVE BIOGEL PI INDICATOR 8 (GLOVE) ×2
GOWN STRL REUS W/ TWL LRG LVL3 (GOWN DISPOSABLE) ×6 IMPLANT
GOWN STRL REUS W/ TWL XL LVL3 (GOWN DISPOSABLE) ×4 IMPLANT
GOWN STRL REUS W/TWL LRG LVL3 (GOWN DISPOSABLE) ×6
GOWN STRL REUS W/TWL XL LVL3 (GOWN DISPOSABLE) ×4
HANDLE SUCTION POOLE (INSTRUMENTS) ×2 IMPLANT
KIT BASIN OR (CUSTOM PROCEDURE TRAY) ×4 IMPLANT
KIT ROOM TURNOVER OR (KITS) ×4 IMPLANT
LIGASURE IMPACT 36 18CM CVD LR (INSTRUMENTS) ×4 IMPLANT
NS IRRIG 1000ML POUR BTL (IV SOLUTION) ×8 IMPLANT
PACK GENERAL/GYN (CUSTOM PROCEDURE TRAY) ×4 IMPLANT
PAD ARMBOARD 7.5X6 YLW CONV (MISCELLANEOUS) ×4 IMPLANT
RELOAD PROXIMATE 75MM BLUE (ENDOMECHANICALS) ×20 IMPLANT
SPECIMEN JAR LARGE (MISCELLANEOUS) IMPLANT
SPONGE GAUZE 4X4 12PLY STER LF (GAUZE/BANDAGES/DRESSINGS) ×4 IMPLANT
SPONGE LAP 18X18 X RAY DECT (DISPOSABLE) ×8 IMPLANT
STAPLER GUN LINEAR PROX 60 (STAPLE) ×4 IMPLANT
STAPLER PROXIMATE 75MM BLUE (STAPLE) ×8 IMPLANT
STAPLER VISISTAT 35W (STAPLE) IMPLANT
SUCTION POOLE HANDLE (INSTRUMENTS) ×4
SUCTION POOLE TIP (SUCTIONS) ×4 IMPLANT
SUT PDS AB 1 TP1 96 (SUTURE) ×8 IMPLANT
SUT SILK 2 0 SH CR/8 (SUTURE) ×8 IMPLANT
SUT SILK 2 0 TIES 10X30 (SUTURE) ×4 IMPLANT
SUT SILK 3 0 SH CR/8 (SUTURE) ×4 IMPLANT
SUT SILK 3 0 TIES 10X30 (SUTURE) ×4 IMPLANT
TAPE CLOTH SURG 4X10 WHT LF (GAUZE/BANDAGES/DRESSINGS) ×4 IMPLANT
TOWEL OR 17X26 10 PK STRL BLUE (TOWEL DISPOSABLE) ×8 IMPLANT
TRAY FOLEY CATH 16FRSI W/METER (SET/KITS/TRAYS/PACK) ×4 IMPLANT
YANKAUER SUCT BULB TIP NO VENT (SUCTIONS) ×4 IMPLANT
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
## 2016-02-26 NOTE — Progress Notes (Signed)
Orthopedic Tech Progress Note Patient Details:  Birdie HopesVertie P Gully 14-Aug-1927 [id number]  Ortho Devices Type of Ortho Device: Ace wrap, Volar splint Ortho Device/Splint Location: LUE Ortho Device/Splint Interventions: Ordered, Application   Jennye MoccasinHughes, Allona Gondek Craig 03/17/2016, 7:48 PM

## 2016-02-26 NOTE — Progress Notes (Signed)
Dr. Luisa Hartornett notified of hypotension. Order to transfuse two units of PRBC, give two liters of NS, and neo to maintain systolic BP>100. Orders carried out. Will continue to monitor closely.

## 2016-02-26 NOTE — OR Nursing (Addendum)
C-Collar off at 1355 and re-applied after intubation by Dr. Maple HudsonMoser/ reported by T. SinclairRN  3100 NOTIFIED OF CLOSING @1500  (1ST CALL)

## 2016-02-26 NOTE — Anesthesia Procedure Notes (Signed)
Procedure Name: Intubation Date/Time: 03/07/2016 1:59 PM Performed by: Darcey NoraJAMES, Eldine Rencher B Pre-anesthesia Checklist: Patient identified, Emergency Drugs available, Suction available and Patient being monitored Patient Re-evaluated:Patient Re-evaluated prior to inductionOxygen Delivery Method: Circle system utilized Preoxygenation: Pre-oxygenation with 100% oxygen Intubation Type: IV induction Ventilation: Mask ventilation without difficulty Laryngoscope Size: Mac and 3 (Glidescope) Grade View: Grade II Tube type: Subglottic suction tube Tube size: 7.5 mm Number of attempts: 2 (KBJ unable to pass ; Dr. Maple HudsonMoser accomplished) Airway Equipment and Method: Stylet and Video-laryngoscopy Placement Confirmation: ETT inserted through vocal cords under direct vision,  positive ETCO2 and breath sounds checked- equal and bilateral Secured at: 22 (cm at teeth) cm Tube secured with: Tape Dental Injury: Teeth and Oropharynx as per pre-operative assessment
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
## 2016-02-26 NOTE — ED Notes (Signed)
Per Verdie MosherLiu, MD pt has increased HR, decreasing BP & + FAST, pt to be Activated as a Level 1
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
## 2016-02-26 NOTE — ED Notes (Signed)
Family at beside. Family given emotional support. 

## 2016-02-26 NOTE — Transfer of Care (Signed)
Immediate Anesthesia Transfer of Care Note  Patient: Tabitha Bryant  Procedure(s) Performed: Procedure(s): EXPLORATORY LAPAROTOMY (N/A) SMALL BOWEL RESECTION TIMES 2 OMENTECTOMY (N/A)  Patient Location: NICU  Anesthesia Type:General  Level of Consciousness: sedated and Patient remains intubated per anesthesia plan  Airway & Oxygen Therapy: Patient remains intubated per anesthesia plan and Patient placed on Ventilator (see vital sign flow sheet for setting)  Post-op Assessment: Report given to RN and Post -op Vital signs reviewed and stable  Post vital signs: Reviewed and stable  Last Vitals:  Vitals:   03/04/2016 1326 03/15/2016 1550  BP: 110/64   Pulse:  80  Resp:  14  Temp:      Last Pain:  Vitals:   03/20/2016 1330  TempSrc:   PainSc: 10-Worst pain ever         Complications: No apparent anesthesia complications
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
## 2016-02-26 NOTE — Anesthesia Postprocedure Evaluation (Signed)
Anesthesia Post Note  Patient: Tabitha Bryant  Procedure(s) Performed: Procedure(s) (LRB): EXPLORATORY LAPAROTOMY (N/A) SMALL BOWEL RESECTION TIMES 2 OMENTECTOMY (N/A)  Patient location during evaluation: ICU Anesthesia Type: General Level of consciousness: sedated Pain management: pain level controlled Vital Signs Assessment: post-procedure vital signs reviewed and stable Respiratory status: patient on ventilator - see flowsheet for VS and patient remains intubated per anesthesia plan Cardiovascular status: stable Anesthetic complications: no    Last Vitals:  Vitals:   03/03/2016 1326 03/20/2016 1550  BP: 110/64   Pulse:  80  Resp:  14  Temp:      Last Pain:  Vitals:   03/04/2016 1330  TempSrc:   PainSc: 10-Worst pain ever                 Kwanza Cancelliere

## 2016-02-26 NOTE — Anesthesia Procedure Notes (Signed)
Central Venous Catheter Insertion Performed by: anesthesiologist Patient location: Pre-op. Preanesthetic checklist: patient identified, IV checked, site marked, risks and benefits discussed, surgical consent, monitors and equipment checked, pre-op evaluation, timeout performed and anesthesia consent Position: supine Landmarks identified Catheter size: 9 Fr Sheath introducer Procedure performed without using ultrasound guided technique. Attempts: 1 Following insertion, line sutured, dressing applied and Biopatch. Post procedure assessment: blood return through all ports, free fluid flow and no air. Patient tolerated the procedure well with no immediate complications.

## 2016-02-26 NOTE — Progress Notes (Signed)
Pt transported to and from CT without incident ?
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
## 2016-02-26 NOTE — Anesthesia Preprocedure Evaluation (Signed)
Anesthesia Evaluation  Patient identified by MRN, date of birth, ID band Patient awake    Reviewed: Unable to perform ROS - Chart review onlyPreop documentation limited or incomplete due to emergent nature of procedure.  History of Anesthesia Complications Negative for: history of anesthetic complications  Airway Mallampati: IV   Neck ROM: Limited   Comment: c collar Dental  (+) Teeth Intact   Pulmonary    + rhonchi        Cardiovascular hypertension, Pt. on medications  Rhythm:Regular Rate:Tachycardia     Neuro/Psych    GI/Hepatic GERD  ,  Endo/Other  diabetes, Type 2Hypothyroidism   Renal/GU      Musculoskeletal   Abdominal   Peds  Hematology   Anesthesia Other Findings   Reproductive/Obstetrics                             Anesthesia Physical Anesthesia Plan  ASA: IV and emergent  Anesthesia Plan: General   Post-op Pain Management:    Induction: Intravenous, Rapid sequence and Cricoid pressure planned  Airway Management Planned: Oral ETT  Additional Equipment: CVP and Arterial line  Intra-op Plan:   Post-operative Plan: Post-operative intubation/ventilation  Informed Consent:   History available from chart only and Only emergency history available  Plan Discussed with: CRNA and Surgeon  Anesthesia Plan Comments:         Anesthesia Quick Evaluation

## 2016-02-27 ENCOUNTER — Encounter (HOSPITAL_COMMUNITY): Payer: Self-pay | Admitting: General Surgery

## 2016-02-27 ENCOUNTER — Inpatient Hospital Stay (HOSPITAL_COMMUNITY): Payer: Medicare Other

## 2016-02-27 LAB — POCT I-STAT 3, ART BLOOD GAS (G3+)
ACID-BASE DEFICIT: 10 mmol/L — AB (ref 0.0–2.0)
Bicarbonate: 15.1 mmol/L — ABNORMAL LOW (ref 20.0–28.0)
O2 SAT: 92 %
PCO2 ART: 31.8 mmHg — AB (ref 32.0–48.0)
PH ART: 7.285 — AB (ref 7.350–7.450)
Patient temperature: 98.4
TCO2: 16 mmol/L (ref 0–100)
pO2, Arterial: 69 mmHg — ABNORMAL LOW (ref 83.0–108.0)

## 2016-02-27 LAB — GLUCOSE, CAPILLARY
GLUCOSE-CAPILLARY: 195 mg/dL — AB (ref 65–99)
GLUCOSE-CAPILLARY: 224 mg/dL — AB (ref 65–99)
GLUCOSE-CAPILLARY: 171 mg/dL — AB (ref 65–99)
Glucose-Capillary: 312 mg/dL — ABNORMAL HIGH (ref 65–99)
Glucose-Capillary: 355 mg/dL — ABNORMAL HIGH (ref 65–99)
Glucose-Capillary: 241 mg/dL — ABNORMAL HIGH (ref 65–99)

## 2016-02-27 LAB — CBC
HCT: 46.9 % — ABNORMAL HIGH (ref 36.0–46.0)
HEMOGLOBIN: 15.9 g/dL — AB (ref 12.0–15.0)
MCH: 30.2 pg (ref 26.0–34.0)
MCHC: 33.9 g/dL (ref 30.0–36.0)
MCV: 89.2 fL (ref 78.0–100.0)
PLATELETS: 166 10*3/uL (ref 150–400)
RBC: 5.26 MIL/uL — AB (ref 3.87–5.11)
RDW: 14.4 % (ref 11.5–15.5)
WBC: 15.3 10*3/uL — ABNORMAL HIGH (ref 4.0–10.5)

## 2016-02-27 LAB — BASIC METABOLIC PANEL
ANION GAP: 18 — AB (ref 5–15)
Anion gap: 9 (ref 5–15)
BUN: 21 mg/dL — AB (ref 6–20)
BUN: 27 mg/dL — AB (ref 6–20)
CALCIUM: 7.3 mg/dL — AB (ref 8.9–10.3)
CHLORIDE: 105 mmol/L (ref 101–111)
CO2: 12 mmol/L — AB (ref 22–32)
CO2: 17 mmol/L — AB (ref 22–32)
CREATININE: 1.86 mg/dL — AB (ref 0.44–1.00)
Calcium: 7.3 mg/dL — ABNORMAL LOW (ref 8.9–10.3)
Chloride: 112 mmol/L — ABNORMAL HIGH (ref 101–111)
Creatinine, Ser: 1.55 mg/dL — ABNORMAL HIGH (ref 0.44–1.00)
GFR calc Af Amer: 34 mL/min — ABNORMAL LOW (ref >60)
GFR calc Af Amer: 27 mL/min — ABNORMAL LOW (ref >60)
GFR, EST NON AFRICAN AMERICAN: 29 mL/min — AB (ref >60)
GFR, EST NON AFRICAN AMERICAN: 23 mL/min — AB (ref >60)
GLUCOSE: 341 mg/dL — AB (ref 65–99)
GLUCOSE: 278 mg/dL — AB (ref 65–99)
POTASSIUM: 6.1 mmol/L — AB (ref 3.5–5.1)
Potassium: 5.9 mmol/L — ABNORMAL HIGH (ref 3.5–5.1)
Sodium: 135 mmol/L (ref 135–145)
Sodium: 138 mmol/L (ref 135–145)

## 2016-02-27 LAB — BLOOD GAS, ARTERIAL
ACID-BASE DEFICIT: 12.9 mmol/L — AB (ref 0.0–2.0)
BICARBONATE: 13.5 mmol/L — AB (ref 20.0–28.0)
DRAWN BY: 44166
FIO2: 0.4
MECHVT: 500 mL
O2 SAT: 93.5 %
PATIENT TEMPERATURE: 98.4
PCO2 ART: 34.9 mmHg (ref 32.0–48.0)
PEEP/CPAP: 5 cmH2O
PH ART: 7.21 — AB (ref 7.350–7.450)
RATE: 16 resp/min
pO2, Arterial: 74.4 mmHg — ABNORMAL LOW (ref 83.0–108.0)

## 2016-02-27 LAB — POTASSIUM: Potassium: 4.7 mmol/L (ref 3.5–5.1)

## 2016-02-27 LAB — CDS SEROLOGY

## 2016-02-27 LAB — PROTIME-INR
INR: 1.27
Prothrombin Time: 16 seconds — ABNORMAL HIGH (ref 11.4–15.2)

## 2016-02-27 LAB — BLOOD PRODUCT ORDER (VERBAL) VERIFICATION

## 2016-02-27 MED ORDER — SODIUM CHLORIDE 0.45 % IV SOLN
INTRAVENOUS | Status: DC
  Filled 2016-02-27 (×2): qty 1000

## 2016-02-27 MED ORDER — SODIUM CHLORIDE 0.45 % IV SOLN
INTRAVENOUS | Status: DC
  Administered 2016-02-27 – 2016-02-29 (×6): via INTRAVENOUS
  Administered 2016-02-29: 1000 mL via INTRAVENOUS

## 2016-02-27 MED ORDER — SODIUM POLYSTYRENE SULFONATE 15 GM/60ML PO SUSP
15.0000 g | Freq: Once | ORAL | Status: AC
  Administered 2016-02-27: 15 g via RECTAL
  Filled 2016-02-27: qty 60

## 2016-02-27 MED ORDER — SODIUM BICARBONATE 8.4 % IV SOLN
INTRAVENOUS | Status: AC
  Administered 2016-02-27: 100 meq via INTRAVENOUS
  Filled 2016-02-27: qty 100

## 2016-02-27 MED ORDER — ENOXAPARIN SODIUM 30 MG/0.3ML ~~LOC~~ SOLN: 30.0000 mg | SUBCUTANEOUS | Status: DC

## 2016-02-27 MED ORDER — INSULIN ASPART 100 UNIT/ML ~~LOC~~ SOLN
0.0000 [IU] | SUBCUTANEOUS | Status: DC
  Administered 2016-02-27: 11 [IU] via SUBCUTANEOUS
  Administered 2016-02-27: 15 [IU] via SUBCUTANEOUS

## 2016-02-27 MED ORDER — ALBUMIN HUMAN 5 % IV SOLN
25.0000 g | Freq: Once | INTRAVENOUS | Status: AC
  Administered 2016-02-27: 25 g via INTRAVENOUS
  Filled 2016-02-27: qty 500

## 2016-02-27 MED ORDER — INSULIN ASPART 100 UNIT/ML ~~LOC~~ SOLN
0.0000 [IU] | SUBCUTANEOUS | Status: DC
  Administered 2016-02-27 (×2): 7 [IU] via SUBCUTANEOUS
  Administered 2016-02-28: 4 [IU] via SUBCUTANEOUS
  Administered 2016-02-28: 7 [IU] via SUBCUTANEOUS
  Administered 2016-02-28: 3 [IU] via SUBCUTANEOUS
  Administered 2016-02-28 (×4): 4 [IU] via SUBCUTANEOUS
  Administered 2016-02-29: 3 [IU] via SUBCUTANEOUS
  Administered 2016-02-29: 4 [IU] via SUBCUTANEOUS
  Administered 2016-02-29 (×2): 3 [IU] via SUBCUTANEOUS
  Administered 2016-02-29: 4 [IU] via SUBCUTANEOUS
  Administered 2016-03-01 (×5): 3 [IU] via SUBCUTANEOUS
  Administered 2016-03-02 – 2016-03-03 (×9): 4 [IU] via SUBCUTANEOUS
  Administered 2016-03-03 (×3): 3 [IU] via SUBCUTANEOUS
  Administered 2016-03-04 (×4): 4 [IU] via SUBCUTANEOUS
  Administered 2016-03-05: 7 [IU] via SUBCUTANEOUS
  Administered 2016-03-05: 3 [IU] via SUBCUTANEOUS
  Administered 2016-03-05 (×4): 4 [IU] via SUBCUTANEOUS
  Administered 2016-03-06: 3 [IU] via SUBCUTANEOUS
  Administered 2016-03-06: 7 [IU] via SUBCUTANEOUS
  Administered 2016-03-06: 4 [IU] via SUBCUTANEOUS
  Administered 2016-03-06: 3 [IU] via SUBCUTANEOUS
  Administered 2016-03-06: 4 [IU] via SUBCUTANEOUS
  Administered 2016-03-06: 7 [IU] via SUBCUTANEOUS
  Administered 2016-03-06 – 2016-03-08 (×4): 3 [IU] via SUBCUTANEOUS

## 2016-02-27 MED ORDER — CALCIUM CHLORIDE 10 % IV SOLN
1.0000 g | Freq: Once | INTRAVENOUS | Status: AC
  Administered 2016-02-27: 1 g via INTRAVENOUS
  Filled 2016-02-27: qty 10

## 2016-02-27 MED ORDER — ENOXAPARIN SODIUM 30 MG/0.3ML ~~LOC~~ SOLN: 30.0000 mg | Freq: Two times a day (BID) | SUBCUTANEOUS | Status: DC

## 2016-02-27 MED ORDER — FUROSEMIDE 10 MG/ML IJ SOLN
20.0000 mg | Freq: Once | INTRAMUSCULAR | Status: AC
  Administered 2016-02-27: 20 mg via INTRAVENOUS

## 2016-02-27 MED ORDER — SODIUM BICARBONATE 8.4 % IV SOLN
100.0000 meq | Freq: Once | INTRAVENOUS | Status: AC
  Administered 2016-02-27: 100 meq via INTRAVENOUS

## 2016-02-27 MED ORDER — ENOXAPARIN SODIUM 30 MG/0.3ML ~~LOC~~ SOLN
30.0000 mg | Freq: Two times a day (BID) | SUBCUTANEOUS | Status: DC
  Administered 2016-02-27 – 2016-03-08 (×20): 30 mg via SUBCUTANEOUS
  Filled 2016-02-27 (×20): qty 0.3

## 2016-02-27 MED ORDER — INSULIN ASPART 100 UNIT/ML IV SOLN
10.0000 [IU] | Freq: Once | INTRAVENOUS | Status: AC
  Administered 2016-02-27: 10 [IU] via INTRAVENOUS

## 2016-02-27 MED ORDER — DEXTROSE 50 % IV SOLN
1.0000 | Freq: Once | INTRAVENOUS | Status: AC
  Administered 2016-02-27: 50 mL via INTRAVENOUS
  Filled 2016-02-27: qty 50

## 2016-02-27 MED ORDER — DEXTROSE-NACL 5-0.45 % IV SOLN: INTRAVENOUS | Status: DC

## 2016-02-27 MED ORDER — FUROSEMIDE 10 MG/ML IJ SOLN
INTRAMUSCULAR | Status: AC
  Administered 2016-02-27: 20 mg via INTRAVENOUS
  Filled 2016-02-27: qty 2

## 2016-02-27 MED ORDER — PROPOFOL 1000 MG/100ML IV EMUL
0.0000 ug/kg/min | INTRAVENOUS | Status: DC
  Administered 2016-02-27: 45 ug/kg/min via INTRAVENOUS
  Administered 2016-02-28: 30 ug/kg/min via INTRAVENOUS
  Filled 2016-02-27 (×4): qty 100

## 2016-02-27 MED ORDER — CHLORHEXIDINE GLUCONATE 0.12 % MT SOLN
15.0000 mL | Freq: Two times a day (BID) | OROMUCOSAL | Status: DC
  Administered 2016-02-27 – 2016-03-07 (×20): 15 mL via OROMUCOSAL
  Filled 2016-02-27 (×5): qty 15

## 2016-02-27 MED ORDER — ORAL CARE MOUTH RINSE
15.0000 mL | OROMUCOSAL | Status: DC
  Administered 2016-02-27 – 2016-03-07 (×84): 15 mL via OROMUCOSAL

## 2016-02-27 MED ORDER — SODIUM CHLORIDE 0.9 % IV SOLN
500.0000 mL | Freq: Once | INTRAVENOUS | Status: AC
  Administered 2016-02-27: 500 mL via INTRAVENOUS

## 2016-02-27 MED ORDER — POTASSIUM CHLORIDE 2 MEQ/ML IV SOLN
INTRAVENOUS | Status: DC
  Filled 2016-02-27 (×2): qty 1000

## 2016-02-27 NOTE — Progress Notes (Signed)
Inpatient Diabetes Program Recommendations  AACE/ADA: New Consensus Statement on Inpatient Glycemic Control (2015)  Target Ranges:  Prepandial:   less than 140 mg/dL      Peak postprandial:   less than 180 mg/dL (1-2 hours)      Critically ill patients:  140 - 180 mg/dL   Lab Results  Component Value Date   GLUCAP 241 (H) 02/27/2016    Review of Glycemic Control  Blood sugars uncontrolled at present.  Inpatient Diabetes Program Recommendations:   ICU Glycemic Control Protocol  Thank you. Tabitha Bryant, RD, LDN, CDE Inpatient Diabetes Coordinator [phone]

## 2016-02-27 NOTE — Progress Notes (Signed)
MD made aware of low urine output. Order for 500 cc bolus of NS. Order carried out. Will continue to monitor closely.

## 2016-02-27 NOTE — Progress Notes (Signed)
MD notified of 8 beats of SVT. Order to correct hyperkalemia with insulin and dextrose. Orders carried out. Will continue to monitor closely.
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
## 2016-02-27 NOTE — Progress Notes (Signed)
Subjective: Pt still intubated and sedated.  No significant events overnight.   Objective: Vital signs in last 24 hours: Temp:  [97.4 F (36.3 C)-98.4 F (36.9 C)] 97.9 F (36.6 C) (09/06 0800) Pulse Rate:  [80-128] 104 (09/06 0852) Resp:  [14-31] 25 (09/06 0852) BP: (86-183)/(51-90) 100/74 (09/06 0852) SpO2:  [92 %-100 %] 94 % (09/06 0852) Arterial Line BP: (97-176)/(49-69) 140/68 (09/06 0800) FiO2 (%):  [40 %-100 %] 40 % (09/06 0852) Weight:  [72.6 kg (160 lb)-82.4 kg (181 lb 10.5 oz)] 82.4 kg (181 lb 10.5 oz) (09/06 0600)  Intake/Output from previous day: 09/05 0701 - 09/06 0700 In: 8295.66691.3 [I.V.:4344.3; Blood:2207; NG/GT:90; IV Piggyback:50] Out: 690 [Urine:510; Emesis/NG output:30; Blood:150] Intake/Output this shift: Total I/O In: 109.6 [I.V.:109.6] Out: -    Recent Labs  2016-01-20 1317 2016-01-20 1329 2016-01-20 1432 2016-01-20 1630 02/27/16 0412  HGB 13.4 14.3 8.8* 11.0* 15.9*    Recent Labs  2016-01-20 1630 02/27/16 0412  WBC 9.3 15.3*  RBC 3.70* 5.26*  HCT 33.4* 46.9*  PLT 193 166    Recent Labs  2016-01-20 1630 02/27/16 0412  NA 138 135  K 4.0 6.1*  CL 114* 105  CO2 17* 12*  BUN 14 21*  CREATININE 1.25* 1.55*  GLUCOSE 288* 341*  CALCIUM 7.4* 7.3*    Recent Labs  2016-01-20 1317  INR 1.08    PE:  L UE splint intact.  R clavicle with slight swelling.  Assessment/Plan: Continue NWB On B UEs.  Will follow.   Tabitha Bryant, Tabitha Bryant 02/27/2016, 9:09 AM
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
## 2016-02-28 ENCOUNTER — Inpatient Hospital Stay (HOSPITAL_COMMUNITY): Payer: Medicare Other

## 2016-02-28 ENCOUNTER — Encounter (HOSPITAL_COMMUNITY): Payer: Self-pay | Admitting: Radiology

## 2016-02-28 DIAGNOSIS — Z9049 Acquired absence of other specified parts of digestive tract: Secondary | ICD-10-CM

## 2016-02-28 DIAGNOSIS — M6289 Other specified disorders of muscle: Secondary | ICD-10-CM

## 2016-02-28 DIAGNOSIS — G459 Transient cerebral ischemic attack, unspecified: Secondary | ICD-10-CM

## 2016-02-28 LAB — TYPE AND SCREEN
ABO/RH(D): AB POS
Antibody Screen: NEGATIVE
UNIT DIVISION: 0
UNIT DIVISION: 0
UNIT DIVISION: 0
UNIT DIVISION: 0
Unit division: 0
Unit division: 0

## 2016-02-28 LAB — PREPARE FRESH FROZEN PLASMA
UNIT DIVISION: 0
UNIT DIVISION: 0
Unit division: 0
Unit division: 0

## 2016-02-28 LAB — CBC
HCT: 36.7 % (ref 36.0–46.0)
HEMOGLOBIN: 12.5 g/dL (ref 12.0–15.0)
MCH: 29.8 pg (ref 26.0–34.0)
MCHC: 34.1 g/dL (ref 30.0–36.0)
MCV: 87.6 fL (ref 78.0–100.0)
PLATELETS: 121 10*3/uL — AB (ref 150–400)
RBC: 4.19 MIL/uL (ref 3.87–5.11)
RDW: 15.4 % (ref 11.5–15.5)
WBC: 15.5 10*3/uL — AB (ref 4.0–10.5)

## 2016-02-28 LAB — GLUCOSE, CAPILLARY
GLUCOSE-CAPILLARY: 179 mg/dL — AB (ref 65–99)
GLUCOSE-CAPILLARY: 168 mg/dL — AB (ref 65–99)
GLUCOSE-CAPILLARY: 153 mg/dL — AB (ref 65–99)
Glucose-Capillary: 152 mg/dL — ABNORMAL HIGH (ref 65–99)
Glucose-Capillary: 206 mg/dL — ABNORMAL HIGH (ref 65–99)
Glucose-Capillary: 146 mg/dL — ABNORMAL HIGH (ref 65–99)

## 2016-02-28 LAB — BASIC METABOLIC PANEL
ANION GAP: 8 (ref 5–15)
BUN: 38 mg/dL — ABNORMAL HIGH (ref 6–20)
CALCIUM: 7.9 mg/dL — AB (ref 8.9–10.3)
CHLORIDE: 111 mmol/L (ref 101–111)
CO2: 19 mmol/L — AB (ref 22–32)
CREATININE: 1.48 mg/dL — AB (ref 0.44–1.00)
GFR calc non Af Amer: 31 mL/min — ABNORMAL LOW (ref >60)
GFR, EST AFRICAN AMERICAN: 36 mL/min — AB (ref >60)
Glucose, Bld: 168 mg/dL — ABNORMAL HIGH (ref 65–99)
Potassium: 4.5 mmol/L (ref 3.5–5.1)
SODIUM: 138 mmol/L (ref 135–145)

## 2016-02-28 MED ORDER — IOPAMIDOL (ISOVUE-370) INJECTION 76%
INTRAVENOUS | Status: AC
  Administered 2016-02-28: 50 mL
  Filled 2016-02-28: qty 50

## 2016-02-28 MED ORDER — SODIUM CHLORIDE 0.9 % IV SOLN
25.0000 ug/h | INTRAVENOUS | Status: DC
  Administered 2016-02-28: 25 ug/h via INTRAVENOUS
  Administered 2016-02-29: 150 ug/h via INTRAVENOUS
  Filled 2016-02-28 (×3): qty 50

## 2016-02-28 MED ORDER — FENTANYL CITRATE (PF) 100 MCG/2ML IJ SOLN
50.0000 ug | Freq: Once | INTRAMUSCULAR | Status: AC
  Administered 2016-02-28: 50 ug via INTRAVENOUS

## 2016-02-28 MED ORDER — FENTANYL BOLUS VIA INFUSION
25.0000 ug | INTRAVENOUS | Status: DC | PRN
  Administered 2016-03-01 – 2016-03-02 (×5): 25 ug via INTRAVENOUS
  Filled 2016-02-28: qty 25

## 2016-02-28 MED ORDER — FUROSEMIDE 10 MG/ML IJ SOLN
20.0000 mg | Freq: Once | INTRAMUSCULAR | Status: AC
  Administered 2016-02-28: 20 mg via INTRAVENOUS
  Filled 2016-02-28: qty 2

## 2016-02-28 MED ORDER — METOPROLOL TARTRATE 5 MG/5ML IV SOLN
5.0000 mg | Freq: Four times a day (QID) | INTRAVENOUS | Status: DC | PRN
  Administered 2016-02-29 – 2016-03-07 (×8): 5 mg via INTRAVENOUS
  Filled 2016-02-28 (×9): qty 5
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
## 2016-02-28 NOTE — Progress Notes (Signed)
Not moving rt side (even to pain) at shift change check. Qustionable  Leftward gaze. MD Kinsinger notified. CT ordered, Rapid response called Code stroke called.
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
## 2016-02-28 NOTE — Progress Notes (Signed)
In CT with Dr Roseanne RenoStewart and rapid response team. Will get CTA also

## 2016-02-28 NOTE — Progress Notes (Signed)
Spoke with Dr. Roseanne RenoStewart. CT on phone, of note, pt now will w/draw rt from pain.
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
## 2016-02-29 ENCOUNTER — Inpatient Hospital Stay (HOSPITAL_COMMUNITY): Payer: Medicare Other

## 2016-02-29 DIAGNOSIS — E875 Hyperkalemia: Secondary | ICD-10-CM | POA: Diagnosis not present

## 2016-02-29 DIAGNOSIS — S42001A Fracture of unspecified part of right clavicle, initial encounter for closed fracture: Secondary | ICD-10-CM | POA: Diagnosis present

## 2016-02-29 DIAGNOSIS — S2239XA Fracture of one rib, unspecified side, initial encounter for closed fracture: Secondary | ICD-10-CM | POA: Diagnosis present

## 2016-02-29 DIAGNOSIS — I639 Cerebral infarction, unspecified: Secondary | ICD-10-CM

## 2016-02-29 DIAGNOSIS — S2249XA Multiple fractures of ribs, unspecified side, initial encounter for closed fracture: Secondary | ICD-10-CM | POA: Diagnosis present

## 2016-02-29 DIAGNOSIS — E119 Type 2 diabetes mellitus without complications: Secondary | ICD-10-CM

## 2016-02-29 DIAGNOSIS — N179 Acute kidney failure, unspecified: Secondary | ICD-10-CM | POA: Diagnosis not present

## 2016-02-29 DIAGNOSIS — S52502A Unspecified fracture of the lower end of left radius, initial encounter for closed fracture: Secondary | ICD-10-CM | POA: Diagnosis present

## 2016-02-29 DIAGNOSIS — I6350 Cerebral infarction due to unspecified occlusion or stenosis of unspecified cerebral artery: Secondary | ICD-10-CM

## 2016-02-29 DIAGNOSIS — D62 Acute posthemorrhagic anemia: Secondary | ICD-10-CM | POA: Diagnosis not present

## 2016-02-29 DIAGNOSIS — I6330 Cerebral infarction due to thrombosis of unspecified cerebral artery: Secondary | ICD-10-CM

## 2016-02-29 DIAGNOSIS — J9600 Acute respiratory failure, unspecified whether with hypoxia or hypercapnia: Secondary | ICD-10-CM | POA: Diagnosis present

## 2016-02-29 LAB — CBC
HCT: 30.9 % — ABNORMAL LOW (ref 36.0–46.0)
HEMOGLOBIN: 10.1 g/dL — AB (ref 12.0–15.0)
MCH: 28.9 pg (ref 26.0–34.0)
MCHC: 32.7 g/dL (ref 30.0–36.0)
MCV: 88.5 fL (ref 78.0–100.0)
Platelets: 109 10*3/uL — ABNORMAL LOW (ref 150–400)
RBC: 3.49 MIL/uL — AB (ref 3.87–5.11)
RDW: 15.6 % — ABNORMAL HIGH (ref 11.5–15.5)
WBC: 8.7 10*3/uL (ref 4.0–10.5)

## 2016-02-29 LAB — GLUCOSE, CAPILLARY
GLUCOSE-CAPILLARY: 150 mg/dL — AB (ref 65–99)
GLUCOSE-CAPILLARY: 161 mg/dL — AB (ref 65–99)
Glucose-Capillary: 130 mg/dL — ABNORMAL HIGH (ref 65–99)
Glucose-Capillary: 146 mg/dL — ABNORMAL HIGH (ref 65–99)
Glucose-Capillary: 147 mg/dL — ABNORMAL HIGH (ref 65–99)
Glucose-Capillary: 174 mg/dL — ABNORMAL HIGH (ref 65–99)

## 2016-02-29 LAB — BASIC METABOLIC PANEL
Anion gap: 8 (ref 5–15)
BUN: 45 mg/dL — ABNORMAL HIGH (ref 6–20)
CO2: 20 mmol/L — ABNORMAL LOW (ref 22–32)
Calcium: 7.4 mg/dL — ABNORMAL LOW (ref 8.9–10.3)
Chloride: 108 mmol/L (ref 101–111)
Creatinine, Ser: 1.27 mg/dL — ABNORMAL HIGH (ref 0.44–1.00)
GFR calc Af Amer: 43 mL/min — ABNORMAL LOW (ref >60)
GFR calc non Af Amer: 37 mL/min — ABNORMAL LOW (ref >60)
Glucose, Bld: 154 mg/dL — ABNORMAL HIGH (ref 65–99)
Potassium: 3.7 mmol/L (ref 3.5–5.1)
Sodium: 136 mmol/L (ref 135–145)

## 2016-02-29 LAB — AMMONIA: Ammonia: 36 umol/L — ABNORMAL HIGH (ref 9–35)

## 2016-02-29 MED ORDER — FUROSEMIDE 10 MG/ML IJ SOLN
INTRAMUSCULAR | Status: AC
Start: 2016-02-29 — End: 2016-02-29
  Filled 2016-02-29: qty 2

## 2016-02-29 MED ORDER — SODIUM CHLORIDE 0.9 % IV SOLN
INTRAVENOUS | Status: DC
  Administered 2016-02-29: 23:00:00 via INTRAVENOUS

## 2016-02-29 MED ORDER — ASPIRIN 300 MG RE SUPP
300.0000 mg | Freq: Every day | RECTAL | Status: DC
  Administered 2016-02-29 – 2016-03-04 (×5): 300 mg via RECTAL
  Filled 2016-02-29 (×6): qty 1

## 2016-02-29 MED ORDER — FUROSEMIDE 10 MG/ML IJ SOLN
40.0000 mg | Freq: Two times a day (BID) | INTRAMUSCULAR | Status: DC
  Administered 2016-02-29 – 2016-03-01 (×4): 40 mg via INTRAVENOUS
  Filled 2016-02-29 (×4): qty 4

## 2016-02-29 MED ORDER — FUROSEMIDE 10 MG/ML IJ SOLN
20.0000 mg | Freq: Once | INTRAMUSCULAR | Status: AC
  Administered 2016-02-29: 20 mg via INTRAVENOUS

## 2016-02-29 MED ORDER — SODIUM CHLORIDE 0.9 % IV SOLN
1000.0000 mg | Freq: Once | INTRAVENOUS | Status: AC
  Administered 2016-02-29: 1000 mg via INTRAVENOUS
  Filled 2016-02-29: qty 10

## 2016-02-29 MED ORDER — SODIUM CHLORIDE 0.9 % IV SOLN
500.0000 mg | Freq: Two times a day (BID) | INTRAVENOUS | Status: DC
  Administered 2016-02-29 – 2016-03-01 (×3): 500 mg via INTRAVENOUS
  Filled 2016-02-29 (×5): qty 5

## 2016-02-29 MED ORDER — FENTANYL CITRATE (PF) 100 MCG/2ML IJ SOLN
25.0000 ug | INTRAMUSCULAR | Status: DC | PRN
  Administered 2016-02-29 – 2016-03-07 (×26): 25 ug via INTRAVENOUS
  Filled 2016-02-29 (×26): qty 2
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
## 2016-02-29 NOTE — Progress Notes (Addendum)
Pt to MRI monitored by 2 RN at all times. After 2 sequences, EKG showing artifact so RN asked scan to be stopped to check patient, Pt appeared to be tachypenic, in mild distress, despite O2 sat being 93. MRI was aborted. In MRI hall, pts regulart monitor replaced on patient and her HR was 127 and her RR was 30. , pt was returned to room immediately.In transit, sats decreased to 85, Met by MD and RT and other RN staff in the room, O2 changed to 100% NRB. See orders, pt sats, vitals returned to normal, work of breathing improved but still needs close monitoring. Pt's sons updated by MD and RN in the room.   Addendum: Above occurred between roughly 1600-1630  

## 2016-02-29 NOTE — Progress Notes (Signed)
RN called d/t pt became SOB, increased WOB while in MRI. Pt was returned to ICU.  Found pt already on NRB.  BBS[facility] coarse t/o- rhonchi/coarse crackles.  Dr Janee Mornhompson at bedside, RN gave lasix.  NTS performed x 2 for small/mod thick white secretions.  RR 26, sat 100% on NRB, mild increased WOB noted.  Per MD, continue to monitor.  Pt appears slightly improved.  PRN NTS and bipap order if needed.  Per MD, hold off in bipap for now.

## 2016-02-29 NOTE — Progress Notes (Signed)
Patient ID: Tabitha HopesVertie P Bryant, female   DOB: 1927/08/12, 82 y.o.   MRN: [id number] MR terminated due to increased WOB. Brought back to ICU. On FM O2 with sats in upper 90s. RR 28 with some increased WOB still. NTS done and lasix given with good result. She is doing a little better now. Will see how it goes. I spoke with her son. She may worsen and require BiPAP or intubation. I let him know. Violeta GelinasBurke Keaden Gunnoe, MD, MPH, FACS Trauma: [phone] General Surgery: [phone]

## 2016-02-29 NOTE — Progress Notes (Signed)
EEG completed; results pending.    
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
## 2016-02-29 NOTE — Procedures (Signed)
Extubation Procedure Note  Patient Details:   Name: Tabitha Bryant DOB: Mar 28, 1928 MRN: [id number]   Airway Documentation:     Evaluation  O2 sats: stable throughout Complications: No apparent complications Patient did tolerate procedure well. Bilateral Breath Sounds: Clear w/ snoring respirations.  No distress noted, pt moving good air.  No stridor noted.   No, pt does not speak d/t neuro status currently. MD at bedside to eval pt.  No new RT orders received currently.    Jennette KettleBrowning, Jaeven Wanzer Joy 02/29/2016, 11:07 AM
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
## 2016-02-29 NOTE — Care Management Important Message (Signed)
Important Message  Patient Details  Name: Tabitha Bryant MRN: [id number] Date of Birth: 05/10/1928   Medicare Important Message Given:  Other (see comment)    Praise Stennett Abena 02/29/2016, 11:30 AM

## 2016-03-01 ENCOUNTER — Inpatient Hospital Stay (HOSPITAL_COMMUNITY): Payer: Medicare Other

## 2016-03-01 ENCOUNTER — Inpatient Hospital Stay (HOSPITAL_COMMUNITY): Payer: Medicare Other | Admitting: Anesthesiology

## 2016-03-01 DIAGNOSIS — N179 Acute kidney failure, unspecified: Secondary | ICD-10-CM

## 2016-03-01 DIAGNOSIS — I6330 Cerebral infarction due to thrombosis of unspecified cerebral artery: Secondary | ICD-10-CM

## 2016-03-01 DIAGNOSIS — J9601 Acute respiratory failure with hypoxia: Secondary | ICD-10-CM

## 2016-03-01 DIAGNOSIS — D62 Acute posthemorrhagic anemia: Secondary | ICD-10-CM

## 2016-03-01 DIAGNOSIS — J90 Pleural effusion, not elsewhere classified: Secondary | ICD-10-CM

## 2016-03-01 DIAGNOSIS — E785 Hyperlipidemia, unspecified: Secondary | ICD-10-CM

## 2016-03-01 LAB — GLUCOSE, CAPILLARY
Glucose-Capillary: 139 mg/dL — ABNORMAL HIGH (ref 65–99)
Glucose-Capillary: 119 mg/dL — ABNORMAL HIGH (ref 65–99)
Glucose-Capillary: 145 mg/dL — ABNORMAL HIGH (ref 65–99)
Glucose-Capillary: 134 mg/dL — ABNORMAL HIGH (ref 65–99)

## 2016-03-01 LAB — BASIC METABOLIC PANEL
ANION GAP: 8 (ref 5–15)
BUN: 46 mg/dL — ABNORMAL HIGH (ref 6–20)
CALCIUM: 7.4 mg/dL — AB (ref 8.9–10.3)
CHLORIDE: 109 mmol/L (ref 101–111)
CO2: 24 mmol/L (ref 22–32)
Creatinine, Ser: 1.09 mg/dL — ABNORMAL HIGH (ref 0.44–1.00)
GFR calc non Af Amer: 45 mL/min — ABNORMAL LOW (ref >60)
GFR, EST AFRICAN AMERICAN: 52 mL/min — AB (ref >60)
Glucose, Bld: 144 mg/dL — ABNORMAL HIGH (ref 65–99)
POTASSIUM: 3.4 mmol/L — AB (ref 3.5–5.1)
Sodium: 141 mmol/L (ref 135–145)

## 2016-03-01 LAB — BLOOD GAS, ARTERIAL
ACID-BASE DEFICIT: 2.8 mmol/L — AB (ref 0.0–2.0)
Acid-Base Excess: 0.8 mmol/L (ref 0.0–2.0)
BICARBONATE: 22.8 mmol/L (ref 20.0–28.0)
Bicarbonate: 25.1 mmol/L (ref 20.0–28.0)
DRAWN BY: 46203
Drawn by: 398981
FIO2: 100
FIO2: 44
O2 SAT: 98.6 %
O2 SAT: 92.6 %
PATIENT TEMPERATURE: 98.6
PCO2 ART: 48.5 mmHg — AB (ref 32.0–48.0)
PH ART: 7.293 — AB (ref 7.350–7.450)
PO2 ART: 135 mmHg — AB (ref 83.0–108.0)
Patient temperature: 98.6
pCO2 arterial: 42.1 mmHg (ref 32.0–48.0)
pH, Arterial: 7.393 (ref 7.350–7.450)
pO2, Arterial: 62.8 mmHg — ABNORMAL LOW (ref 83.0–108.0)

## 2016-03-01 LAB — LIPID PANEL
CHOL/HDL RATIO: 15.3 ratio
Cholesterol: 138 mg/dL (ref 0–200)
HDL: 9 mg/dL — AB (ref >40)
LDL CALC: UNDETERMINED mg/dL (ref 0–99)
Triglycerides: 511 mg/dL — ABNORMAL HIGH (ref <150)
VLDL: UNDETERMINED mg/dL (ref 0–40)

## 2016-03-01 LAB — CBC
HCT: 33 % — ABNORMAL LOW (ref 36.0–46.0)
HEMATOCRIT: 31.1 % — AB (ref 36.0–46.0)
HEMOGLOBIN: 10.2 g/dL — AB (ref 12.0–15.0)
HEMOGLOBIN: 10.8 g/dL — AB (ref 12.0–15.0)
MCH: 29.4 pg (ref 26.0–34.0)
MCH: 29.5 pg (ref 26.0–34.0)
MCHC: 32.8 g/dL (ref 30.0–36.0)
MCHC: 32.7 g/dL (ref 30.0–36.0)
MCV: 89.6 fL (ref 78.0–100.0)
MCV: 90.2 fL (ref 78.0–100.0)
PLATELETS: 189 10*3/uL (ref 150–400)
Platelets: 145 10*3/uL — ABNORMAL LOW (ref 150–400)
RBC: 3.47 MIL/uL — AB (ref 3.87–5.11)
RBC: 3.66 MIL/uL — AB (ref 3.87–5.11)
RDW: 16 % — AB (ref 11.5–15.5)
RDW: 16 % — ABNORMAL HIGH (ref 11.5–15.5)
WBC: 11.3 10*3/uL — AB (ref 4.0–10.5)
WBC: 12.3 10*3/uL — ABNORMAL HIGH (ref 4.0–10.5)

## 2016-03-01 LAB — POTASSIUM: POTASSIUM: 2.2 mmol/L — AB (ref 3.5–5.1)

## 2016-03-01 LAB — MAGNESIUM: MAGNESIUM: 1.4 mg/dL — AB (ref 1.7–2.4)

## 2016-03-01 MED ORDER — METOPROLOL TARTRATE 5 MG/5ML IV SOLN
5.0000 mg | INTRAVENOUS | Status: AC
  Administered 2016-03-01: 5 mg via INTRAVENOUS

## 2016-03-01 MED ORDER — ETOMIDATE 2 MG/ML IV SOLN
INTRAVENOUS | Status: DC | PRN
  Administered 2016-03-01: 8 mg via INTRAVENOUS

## 2016-03-01 MED ORDER — POTASSIUM CHLORIDE 10 MEQ/50ML IV SOLN
10.0000 meq | INTRAVENOUS | Status: AC
  Administered 2016-03-02 (×6): 10 meq via INTRAVENOUS
  Filled 2016-03-01 (×6): qty 50

## 2016-03-01 MED ORDER — FAMOTIDINE IN NACL 20-0.9 MG/50ML-% IV SOLN
20.0000 mg | Freq: Two times a day (BID) | INTRAVENOUS | Status: DC
  Administered 2016-03-02 – 2016-03-04 (×5): 20 mg via INTRAVENOUS
  Filled 2016-03-01 (×5): qty 50

## 2016-03-01 MED ORDER — SODIUM CHLORIDE 0.9 % IV SOLN
INTRAVENOUS | Status: DC | PRN
  Administered 2016-03-01: 23:00:00 via INTRAVENOUS

## 2016-03-01 MED ORDER — WHITE PETROLATUM GEL
Status: AC
Start: 2016-03-01 — End: 2016-03-01
  Administered 2016-03-01: 11:00:00
  Filled 2016-03-01: qty 1

## 2016-03-01 NOTE — Progress Notes (Signed)
PT Cancellation Note  Patient Details Name: Birdie HopesVertie P Levenhagen MRN: [id number] DOB: [birth year]   Cancelled Treatment:    Reason Eval/Treat Not Completed: Patient not medically ready.  Patient remains on bedrest per orders.   MD:  Please write activity orders when appropriate for patient.  Will initiate PT evaluation at that time.  Thank you.   Vena AustriaDavis, Merinda Victorino H 03/01/2016, 11:20 AM Durenda HurtSusan H. Renaldo Fiddleravis, PT, [facility] Pager [phone]

## 2016-03-01 NOTE — Transfer of Care (Signed)
Immediate Anesthesia Transfer of Care Note  Patient: Tabitha Bryant  Emergent intubation Last Vitals:  Vitals:   03/01/16 2000 03/01/16 2332  BP: (!) 134/50   Pulse: (!) 106   Resp:  (!) 29  Temp: 37.6 C     Last Pain:  Vitals:   03/01/16 2000  TempSrc: Axillary  PainSc:

## 2016-03-01 NOTE — Progress Notes (Signed)
OT Cancellation Note  Patient Details Name: Tabitha HopesVertie P Bryant MRN: [id number] DOB: August 01, 1927   Cancelled Treatment:    Reason Eval/Treat Not Completed: Patient not medically ready (active bedrest orders).  Gaye AlkenBailey A Emmamarie Kluender M.S., OTR/L Pager: [phone]  03/01/2016, 4:53 PM

## 2016-03-01 NOTE — Progress Notes (Signed)
Notified Dr Andrey CampanileWilson of patients continued increased WOB and current VS- HR 90-100s, RR 25, spO2 98. Have been giving fentanyl routinely to help with the tachypnea and WOB. Patients mental status unchanged- still opens eyes to voice and FC in lower exts, GCS 9. Orders to get an ABG in morning and continue to monitor.
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
## 2016-03-01 NOTE — Progress Notes (Signed)
SLP Cancellation Note  Patient Details Name: Tabitha Bryant MRN: [id number] DOB: Nov 05, 1927   Cancelled treatment:       Reason Eval/Treat Not Completed: Patient not medically ready. Pt not ready for swallow eval given mentation and respiratory status. Will continue to follow for readiness.   Metro Kungle[place], [place] M[place] K, MA, CCC-SLP 03/01/2016, 12:26 PM
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
## 2016-03-01 NOTE — Anesthesia Procedure Notes (Signed)
Procedure Name: Intubation Date/Time: 03/01/2016 11:15 PM Performed by: Arno Cullers S Pre-anesthesia Checklist: Patient identified, Emergency Drugs available, Suction available and Patient being monitored Patient Re-evaluated:Patient Re-evaluated prior to inductionOxygen Delivery Method: Ambu bag Preoxygenation: Pre-oxygenation with 100% oxygen Intubation Type: IV induction Laryngoscope size: planned glidescope. Tube type: Subglottic suction tube Tube size: 7.0 mm Number of attempts: 1 Airway Equipment and Method: Video-laryngoscopy Placement Confirmation: ETT inserted through vocal cords under direct vision,  CO2 detector and breath sounds checked- equal and bilateral Secured at: 22 cm Tube secured with: Tape

## 2016-03-02 ENCOUNTER — Inpatient Hospital Stay (HOSPITAL_COMMUNITY): Payer: Medicare Other

## 2016-03-02 DIAGNOSIS — I4891 Unspecified atrial fibrillation: Secondary | ICD-10-CM

## 2016-03-02 DIAGNOSIS — G9340 Encephalopathy, unspecified: Secondary | ICD-10-CM

## 2016-03-02 DIAGNOSIS — I63112 Cerebral infarction due to embolism of left vertebral artery: Secondary | ICD-10-CM

## 2016-03-02 DIAGNOSIS — I6789 Other cerebrovascular disease: Secondary | ICD-10-CM

## 2016-03-02 LAB — ECHOCARDIOGRAM COMPLETE
AV area mean vel ind: 0.98 cm2/m2
AV pk vel: 187 cm/s
AVAREAMEANV: 1.93 cm2
AVAREAVTI: 2.47 cm2
AVAREAVTIIND: 0.96 cm2/m2
AVCELMEANRAT: 0.62
AVG: 6 mmHg
AVPG: 14 mmHg
Ao pk vel: 0.79 m/s
Ao-asc: 36 cm
CHL CUP AV PEAK INDEX: 1.25
CHL CUP AV VALUE AREA INDEX: 0.96
CHL CUP AV VEL: 1.91
CHL CUP DOP CALC LVOT VTI: 18.5 cm
CHL CUP MV DEC (S): 387
CHL CUP STROKE VOLUME: 28 mL
DOP CAL AO MEAN VELOCITY: 118 cm/s
EWDT: 387 ms
HEIGHTINCHES: 66.5 in
LA vol A4C: 20.5 ml
LA vol index: 12.7 mL/m2
LA vol: 25.1 mL
LDCA: 3.14 cm2
LV sys vol index: 6 mL/m2
LV sys vol: 12 mL — AB (ref 14–42)
LVDIAVOL: 40 mL — AB (ref 46–106)
LVDIAVOLIN: 20 mL/m2
LVOT peak VTI: 0.61 cm
LVOT peak grad rest: 9 mmHg
LVOTD: 20 mm
LVOTPV: 147 cm/s
LVOTSV: 58 mL
MV pk E vel: 66.5 m/s
MVPKAVEL: 76.2 m/s
RV TAPSE: 19.5 mm
Simpson's disk: 71
VTI: 30.4 cm
Valve area: 1.91 cm2
WEIGHTICAEL: 3082.91 [oz_av]

## 2016-03-02 LAB — HEMOGLOBIN A1C
HEMOGLOBIN A1C: 5.6 % (ref 4.8–5.6)
MEAN PLASMA GLUCOSE: 114 mg/dL

## 2016-03-02 LAB — BLOOD GAS, ARTERIAL
ACID-BASE EXCESS: 0.7 mmol/L (ref 0.0–2.0)
Bicarbonate: 24.1 mmol/L (ref 20.0–28.0)
Drawn by: 46203
FIO2: 100
LHR: 14 {breaths}/min
O2 SAT: 99.3 %
PCO2 ART: 34.4 mmHg (ref 32.0–48.0)
PEEP: 5 cmH2O
Patient temperature: 98.6
VT: 471 mL
pH, Arterial: 7.46 — ABNORMAL HIGH (ref 7.350–7.450)
pO2, Arterial: 179 mmHg — ABNORMAL HIGH (ref 83.0–108.0)

## 2016-03-02 LAB — COMPREHENSIVE METABOLIC PANEL
ALBUMIN: 1.5 g/dL — AB (ref 3.5–5.0)
ALT: 67 U/L — AB (ref 14–54)
AST: 40 U/L (ref 15–41)
Alkaline Phosphatase: 56 U/L (ref 38–126)
Anion gap: 8 (ref 5–15)
BUN: 39 mg/dL — AB (ref 6–20)
CHLORIDE: 117 mmol/L — AB (ref 101–111)
CO2: 21 mmol/L — AB (ref 22–32)
Calcium: 6.2 mg/dL — CL (ref 8.9–10.3)
Creatinine, Ser: 0.9 mg/dL (ref 0.44–1.00)
GFR calc Af Amer: >60 mL/min (ref >60)
GFR, EST NON AFRICAN AMERICAN: 56 mL/min — AB (ref >60)
Glucose, Bld: 135 mg/dL — ABNORMAL HIGH (ref 65–99)
POTASSIUM: 2.2 mmol/L — AB (ref 3.5–5.1)
SODIUM: 146 mmol/L — AB (ref 135–145)
Total Bilirubin: 1.2 mg/dL (ref 0.3–1.2)
Total Protein: 3.8 g/dL — ABNORMAL LOW (ref 6.5–8.1)

## 2016-03-02 LAB — GLUCOSE, CAPILLARY
GLUCOSE-CAPILLARY: 170 mg/dL — AB (ref 65–99)
GLUCOSE-CAPILLARY: 169 mg/dL — AB (ref 65–99)
GLUCOSE-CAPILLARY: 170 mg/dL — AB (ref 65–99)
Glucose-Capillary: 123 mg/dL — ABNORMAL HIGH (ref 65–99)
Glucose-Capillary: 153 mg/dL — ABNORMAL HIGH (ref 65–99)
Glucose-Capillary: 160 mg/dL — ABNORMAL HIGH (ref 65–99)
Glucose-Capillary: 165 mg/dL — ABNORMAL HIGH (ref 65–99)

## 2016-03-02 LAB — TSH: TSH: 0.895 u[IU]/mL (ref 0.350–4.500)

## 2016-03-02 LAB — DIGOXIN LEVEL: Digoxin Level: <0.2 ng/mL — ABNORMAL LOW (ref 0.8–2.0)

## 2016-03-02 MED ORDER — ACETAMINOPHEN 650 MG RE SUPP
650.0000 mg | Freq: Four times a day (QID) | RECTAL | Status: DC | PRN
  Administered 2016-03-02: 650 mg via RECTAL
  Filled 2016-03-02: qty 1

## 2016-03-02 MED ORDER — DEXTROSE 5 % IV SOLN
5.0000 mg/h | INTRAVENOUS | Status: DC
  Administered 2016-03-02 – 2016-03-03 (×2): 5 mg/h via INTRAVENOUS
  Filled 2016-03-02 (×3): qty 100

## 2016-03-02 MED ORDER — PERFLUTREN LIPID MICROSPHERE
1.0000 mL | INTRAVENOUS | Status: AC | PRN
  Administered 2016-03-02: 2 mL via INTRAVENOUS
  Filled 2016-03-02: qty 10

## 2016-03-02 NOTE — Progress Notes (Signed)
  Echocardiogram 2D Echocardiogram with Definity has been performed.  Janalyn HarderWest, Jovanny Stephanie R 03/02/2016, 10:17 AM
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
## 2016-03-02 NOTE — Progress Notes (Signed)
PT Cancellation and discharge Note  Patient Details Name: Tabitha HopesVertie P Pfefferkorn MRN: [id number] DOB: April 13, 1928   Cancelled Treatment:    Reason Eval/Treat Not Completed: Patient not medically ready   Noted last night's status and events; now intubated; Bedrest;   Will sign off at this point and be happy to reconsult with new orders for PT and increased activity when medically ready;  Thank you,  Van ClinesHolly Khilynn Borntreger, PT  Acute Rehabilitation Services Pager [phone] Office [phone]    Van ClinesGarrigan, Jacorey Donaway [facility] LLC[facility]ff 03/02/2016, 7:50 AM

## 2016-03-02 NOTE — Progress Notes (Signed)
Patient went into Afib RVR, temporarily responsive to lopressor; waning mental status throughout the evening and increasing work of breathing. Hypokalemia being corrected. HGB stable/hemoconcentrated. Stable CXR. ABG without hypercarbia. Reintubated, dilt gtt started (she is on digoxin, will check level), stop lasix, head CT repeat pending. Updated her son, Jesusita OkaDan.
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
## 2016-03-02 NOTE — Progress Notes (Signed)
Dr. Fredricka Bonineonnor spoke with patients son Jesusita OkaDan concerning reintubation. Son agreeable. Anesthesia called and reintubated patient at approximately 2315. PAtient tolerated intubation well. Patient to CT at this time.

## 2016-03-02 NOTE — Progress Notes (Signed)
Dr. Fredricka Bonineonnor in on rounds, patient more tachypnic with RR mid 30s with increased work of breathing.  During bedside assessment patient noted to be unresponsive. No longer opening eyes when name is called. Patient no longer grimacing or pulling from painful stimuli. Dr. Roseanne RenoStewart notified of mental status change, head CT ordered.
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
## 2016-03-03 DIAGNOSIS — R7881 Bacteremia: Secondary | ICD-10-CM

## 2016-03-03 DIAGNOSIS — I63133 Cerebral infarction due to embolism of bilateral carotid arteries: Secondary | ICD-10-CM

## 2016-03-03 DIAGNOSIS — I480 Paroxysmal atrial fibrillation: Secondary | ICD-10-CM

## 2016-03-03 LAB — BLOOD CULTURE ID PANEL (REFLEXED)
Acinetobacter baumannii: NOT DETECTED
CANDIDA ALBICANS: NOT DETECTED
CANDIDA GLABRATA: NOT DETECTED
CANDIDA KRUSEI: NOT DETECTED
CANDIDA PARAPSILOSIS: NOT DETECTED
CANDIDA TROPICALIS: NOT DETECTED
Carbapenem resistance: NOT DETECTED
ENTEROBACTER CLOACAE COMPLEX: NOT DETECTED
ESCHERICHIA COLI: NOT DETECTED
Enterobacteriaceae species: DETECTED — AB
Enterococcus species: NOT DETECTED
Haemophilus influenzae: NOT DETECTED
KLEBSIELLA PNEUMONIAE: NOT DETECTED
Klebsiella oxytoca: NOT DETECTED
Listeria monocytogenes: NOT DETECTED
Methicillin resistance: DETECTED — AB
Neisseria meningitidis: NOT DETECTED
PROTEUS SPECIES: NOT DETECTED
Pseudomonas aeruginosa: NOT DETECTED
SERRATIA MARCESCENS: NOT DETECTED
STREPTOCOCCUS PYOGENES: NOT DETECTED
Staphylococcus aureus (BCID): NOT DETECTED
Staphylococcus species: DETECTED — AB
Streptococcus agalactiae: NOT DETECTED
Streptococcus pneumoniae: NOT DETECTED
Streptococcus species: NOT DETECTED

## 2016-03-03 LAB — GLUCOSE, CAPILLARY
GLUCOSE-CAPILLARY: 166 mg/dL — AB (ref 65–99)
GLUCOSE-CAPILLARY: 147 mg/dL — AB (ref 65–99)
GLUCOSE-CAPILLARY: 169 mg/dL — AB (ref 65–99)
Glucose-Capillary: 146 mg/dL — ABNORMAL HIGH (ref 65–99)
Glucose-Capillary: 138 mg/dL — ABNORMAL HIGH (ref 65–99)
Glucose-Capillary: 151 mg/dL — ABNORMAL HIGH (ref 65–99)
Glucose-Capillary: 135 mg/dL — ABNORMAL HIGH (ref 65–99)

## 2016-03-03 LAB — URINE CULTURE: Culture: NO GROWTH

## 2016-03-03 MED ORDER — DEXTROSE 5 % IV SOLN
1.0000 g | Freq: Three times a day (TID) | INTRAVENOUS | Status: DC
  Administered 2016-03-03 – 2016-03-04 (×3): 1 g via INTRAVENOUS
  Filled 2016-03-03 (×4): qty 1

## 2016-03-03 MED ORDER — VANCOMYCIN HCL 10 G IV SOLR
1250.0000 mg | Freq: Once | INTRAVENOUS | Status: AC
  Administered 2016-03-03: 1250 mg via INTRAVENOUS
  Filled 2016-03-03: qty 1250

## 2016-03-03 MED ORDER — POTASSIUM CHLORIDE 10 MEQ/50ML IV SOLN
10.0000 meq | INTRAVENOUS | Status: AC
  Administered 2016-03-03 (×5): 10 meq via INTRAVENOUS
  Filled 2016-03-03 (×5): qty 50

## 2016-03-03 MED ORDER — SODIUM CHLORIDE 0.9 % IV SOLN
2.0000 g | Freq: Once | INTRAVENOUS | Status: AC
  Administered 2016-03-03: 2 g via INTRAVENOUS
  Filled 2016-03-03: qty 20

## 2016-03-03 MED ORDER — VANCOMYCIN HCL IN DEXTROSE 750-5 MG/150ML-% IV SOLN
750.0000 mg | Freq: Two times a day (BID) | INTRAVENOUS | Status: DC
  Administered 2016-03-03 – 2016-03-04 (×2): 750 mg via INTRAVENOUS
  Filled 2016-03-03 (×3): qty 150

## 2016-03-03 MED ORDER — DEXTROSE 5 % IV SOLN
2.0000 g | Freq: Once | INTRAVENOUS | Status: AC
  Administered 2016-03-03: 2 g via INTRAVENOUS
  Filled 2016-03-03 (×2): qty 2

## 2016-03-03 NOTE — Progress Notes (Signed)
       Patient Name: Tabitha MulliganVertie P Bryant Date of Encounter: 03/03/2016    SUBJECTIVE: Intubated.  TELEMETRY:  NSR Vitals:   03/03/16 1200 03/03/16 1300 03/03/16 1400 03/03/16 1500  BP: (!) 159/53 (!) 157/50 (!) 151/47 (!) 139/47  Pulse: (!) 108 94 95 93  Resp: (!) 31 (!) 31 (!) 32 (!) 30  Temp: 98.9 F (37.2 C)     TempSrc: Axillary     SpO2: 92% 94% 93% 95%  Weight:      Height:        Intake/Output Summary (Last 24 hours) at 03/03/16 1538 Last data filed at 03/03/16 1500  Gross per 24 hour  Intake              940 ml  Output             2075 ml  Net            -1135 ml   LABS: Basic Metabolic Panel:  Recent Labs  21/30/8608/03/09 0425 03/01/16 2000 03/01/16 2308  NA 141  --  146*  K 3.4* 2.2* 2.2*  CL 109  --  117*  CO2 24  --  21*  GLUCOSE 144*  --  135*  BUN 46*  --  39*  CREATININE 1.09*  --  0.90  CALCIUM 7.4*  --  6.2*  MG  --   --  1.4*   CBC:  Recent Labs  03/01/16 0425 03/01/16 2308  WBC 11.3* 12.3*  HGB 10.2* 10.8*  HCT 31.1* 33.0*  MCV 89.6 90.2  PLT 145* 189   Cardiac Enzymes: No results for input(s): CKTOTAL, CKMB, CKMBINDEX, TROPONINI in the last 72 hours. BNP: Invalid input(s): POCBNP Hemoglobin A1C:  Recent Labs  03/01/16 0425  HGBA1C 5.6   Fasting Lipid Panel:  Recent Labs  03/01/16 0425  CHOL 138  HDL 9*  LDLCALC UNABLE TO CALCULATE IF TRIGLYCERIDE OVER 400 mg/dL  TRIG [id number]*  CHOLHDL 46.915.3    Radiology/Studies:  No new data  Physical Exam: Blood pressure (!) 139/47, pulse 93, temperature 98.9 F (37.2 C), temperature source Axillary, resp. rate (!) 30, height 5' 6.5" (1.689 m), weight 192 lb 10.9 oz (87.4 kg), SpO2 95 %. Weight change:   Wt Readings from Last 3 Encounters:  02/29/16 192 lb 10.9 oz (87.4 kg)    RRR Chest is clear   ASSESSMENT:  1. PAF with NSR currently 2. Embolic CVA  Plan:  1. Monitor for recurrent AF . 2. Anticoagulation not currently possible. 3 We will follow and help as  needed.  Signed, Lyn RecordsHenry W Smith III 03/03/2016, 3:38 PM

## 2016-03-03 NOTE — Progress Notes (Signed)
RT note-placed back to full support, increased RR

## 2016-03-03 NOTE — Progress Notes (Signed)
Pharmacy Antibiotic Note  Tabitha Bryant is a 82 y.o. female admitted on 03/17/2016 with bacteremia.  Pharmacy has been consulted for cefepime and vancomycin dosing.  Pt received cefepime 2g and vancomycin 1250mg  IV once.  Plan: Vancomycin 750mg  IV every 12 hours.  Goal trough 15-20 mcg/mL.  Cefepime 1g IV q8h Monitor culture data, renal function and [facility]  Height: 5' 6.5" (168.9 cm) Weight: 192 lb 10.9 oz (87.4 kg) IBW/kg (Calculated) : 60.45  Temp (24hrs), Avg:99.9 F (37.7 C), Min:98.8 F (37.1 C), Max:100.8 F (38.2 C)   Recent Labs Lab 2015-12-29 1330  02/27/16 0412 02/27/16 1315 02/28/16 0350 02/29/16 0254 03/01/16 0425 03/01/16 2308  WBC  --   < > 15.3*  --  15.5* 8.7 11.3* 12.3*  CREATININE  --   < > 1.55* 1.86* 1.48* 1.27* 1.09* 0.90  LATICACIDVEN 5.87*  --   --   --   --   --   --   --   < > = values in this interval not displayed.  Estimated Creatinine Clearance: 50.5 mL/min (by C-G formula based on SCr of 0.9 mg/dL).    Allergies  Allergen Reactions  . Penicillins Other (See Comments)    Unknown    Antimicrobials this admission: Cefepime 9/11 >>  Vanc 9/11 >>    Arlean HoppingCorey M. Newman PiesBall, PharmD, BCPS Clinical Pharmacist Pager [phone] 03/03/2016 8:21 AM

## 2016-03-03 NOTE — Progress Notes (Signed)
PHARMACY - PHYSICIAN COMMUNICATION CRITICAL VALUE ALERT - BLOOD CULTURE IDENTIFICATION (BCID)  Results for orders placed or performed during the hospital encounter of 03/09/2016  Blood Culture ID Panel (Reflexed) (Collected: 03/02/2016  9:27 AM)  Result Value Ref Range   Enterococcus species NOT DETECTED NOT DETECTED   Listeria monocytogenes NOT DETECTED NOT DETECTED   Staphylococcus species DETECTED (A) NOT DETECTED   Staphylococcus aureus NOT DETECTED NOT DETECTED   Methicillin resistance DETECTED (A) NOT DETECTED   Streptococcus species NOT DETECTED NOT DETECTED   Streptococcus agalactiae NOT DETECTED NOT DETECTED   Streptococcus pneumoniae NOT DETECTED NOT DETECTED   Streptococcus pyogenes NOT DETECTED NOT DETECTED   Acinetobacter baumannii NOT DETECTED NOT DETECTED   Enterobacteriaceae species DETECTED (A) NOT DETECTED   Enterobacter cloacae complex NOT DETECTED NOT DETECTED   Escherichia coli NOT DETECTED NOT DETECTED   Klebsiella oxytoca NOT DETECTED NOT DETECTED   Klebsiella pneumoniae NOT DETECTED NOT DETECTED   Proteus species NOT DETECTED NOT DETECTED   Serratia marcescens NOT DETECTED NOT DETECTED   Carbapenem resistance NOT DETECTED NOT DETECTED   Haemophilus influenzae NOT DETECTED NOT DETECTED   Neisseria meningitidis NOT DETECTED NOT DETECTED   Pseudomonas aeruginosa NOT DETECTED NOT DETECTED   Candida albicans NOT DETECTED NOT DETECTED   Candida glabrata NOT DETECTED NOT DETECTED   Candida krusei NOT DETECTED NOT DETECTED   Candida parapsilosis NOT DETECTED NOT DETECTED   Candida tropicalis NOT DETECTED NOT DETECTED    Name of physician (or Provider) Contacted: Dr. Janee Mornhompson  Changes to prescribed antibiotics required: Patient not currently on antibiotics. Will add vancomycin and cefepime.  Fredrik RiggerMarkle, Venora Kautzman Sue 03/03/2016  8:19 AM
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
## 2016-03-03 NOTE — Progress Notes (Signed)
OT Cancellation Note and Discharge  Patient Details Name: Birdie HopesVertie P Khalid MRN: [id number] DOB: [birth year]   Cancelled Treatment:    Reason Eval/Treat Not Completed: Patient not medically ready. Pt still on bed rest and is now intubated over weekend. Acute OT will sign off, please re-order as appropriate.  Evette GeorgesLeonard, Jiaire Rosebrook Eva [phone] 03/03/2016, 7:40 AM
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
## 2016-03-03 NOTE — Care Management Important Message (Signed)
Important Message  Patient Details  Name: Tabitha Bryant MRN: [id number] Date of Birth: [birth year]   Medicare Important Message Given:  Yes    [name] 03/03/2016, 1:58 PM

## 2016-03-04 ENCOUNTER — Inpatient Hospital Stay (HOSPITAL_COMMUNITY): Payer: Medicare Other

## 2016-03-04 DIAGNOSIS — K259 Gastric ulcer, unspecified as acute or chronic, without hemorrhage or perforation: Secondary | ICD-10-CM

## 2016-03-04 DIAGNOSIS — R7881 Bacteremia: Secondary | ICD-10-CM

## 2016-03-04 DIAGNOSIS — K922 Gastrointestinal hemorrhage, unspecified: Secondary | ICD-10-CM

## 2016-03-04 DIAGNOSIS — J9600 Acute respiratory failure, unspecified whether with hypoxia or hypercapnia: Secondary | ICD-10-CM

## 2016-03-04 DIAGNOSIS — R001 Bradycardia, unspecified: Secondary | ICD-10-CM

## 2016-03-04 LAB — BASIC METABOLIC PANEL
ANION GAP: 9 (ref 5–15)
BUN: 84 mg/dL — ABNORMAL HIGH (ref 6–20)
CALCIUM: 8.4 mg/dL — AB (ref 8.9–10.3)
CO2: 28 mmol/L (ref 22–32)
Chloride: 118 mmol/L — ABNORMAL HIGH (ref 101–111)
Creatinine, Ser: 1.2 mg/dL — ABNORMAL HIGH (ref 0.44–1.00)
GFR, EST AFRICAN AMERICAN: 46 mL/min — AB (ref >60)
GFR, EST NON AFRICAN AMERICAN: 40 mL/min — AB (ref >60)
GLUCOSE: 201 mg/dL — AB (ref 65–99)
POTASSIUM: 3.4 mmol/L — AB (ref 3.5–5.1)
SODIUM: 155 mmol/L — AB (ref 135–145)

## 2016-03-04 LAB — CBC
HCT: 27.1 % — ABNORMAL LOW (ref 36.0–46.0)
Hemoglobin: 8.6 g/dL — ABNORMAL LOW (ref 12.0–15.0)
MCH: 29.5 pg (ref 26.0–34.0)
MCHC: 31.7 g/dL (ref 30.0–36.0)
MCV: 92.8 fL (ref 78.0–100.0)
PLATELETS: 250 10*3/uL (ref 150–400)
RBC: 2.92 MIL/uL — AB (ref 3.87–5.11)
RDW: 16.4 % — AB (ref 11.5–15.5)
WBC: 22.4 10*3/uL — AB (ref 4.0–10.5)

## 2016-03-04 LAB — GLUCOSE, CAPILLARY
GLUCOSE-CAPILLARY: 161 mg/dL — AB (ref 65–99)
GLUCOSE-CAPILLARY: 181 mg/dL — AB (ref 65–99)
Glucose-Capillary: 155 mg/dL — ABNORMAL HIGH (ref 65–99)
Glucose-Capillary: 165 mg/dL — ABNORMAL HIGH (ref 65–99)
Glucose-Capillary: 118 mg/dL — ABNORMAL HIGH (ref 65–99)
Glucose-Capillary: 201 mg/dL — ABNORMAL HIGH (ref 65–99)

## 2016-03-04 MED ORDER — SODIUM CHLORIDE 0.45 % IV SOLN
INTRAVENOUS | Status: DC
  Administered 2016-03-04 – 2016-03-05 (×2): via INTRAVENOUS

## 2016-03-04 MED ORDER — DEXTROSE 5 % IV SOLN
2.0000 g | INTRAVENOUS | Status: DC
  Administered 2016-03-04 – 2016-03-06 (×3): 2 g via INTRAVENOUS
  Filled 2016-03-04 (×4): qty 2

## 2016-03-04 MED ORDER — PANTOPRAZOLE SODIUM 40 MG IV SOLR
40.0000 mg | Freq: Two times a day (BID) | INTRAVENOUS | Status: DC
  Administered 2016-03-08: 40 mg via INTRAVENOUS
  Filled 2016-03-04: qty 40

## 2016-03-04 MED ORDER — GLUCERNA 1.2 CAL PO LIQD
1000.0000 mL | ORAL | Status: DC
Start: 2016-03-04 — End: 2016-03-05
  Administered 2016-03-04: 1000 mL
  Filled 2016-03-04 (×2): qty 1000

## 2016-03-04 MED ORDER — SODIUM CHLORIDE 0.9 % IV SOLN
80.0000 mg | Freq: Once | INTRAVENOUS | Status: AC
  Administered 2016-03-04: 80 mg via INTRAVENOUS
  Filled 2016-03-04: qty 80

## 2016-03-04 MED ORDER — SODIUM CHLORIDE 0.9 % IV SOLN
8.0000 mg/h | INTRAVENOUS | Status: AC
  Administered 2016-03-04 – 2016-03-07 (×6): 8 mg/h via INTRAVENOUS
  Filled 2016-03-04 (×13): qty 80

## 2016-03-04 MED ORDER — FREE WATER
200.0000 mL | Freq: Three times a day (TID) | Status: DC
  Administered 2016-03-04 – 2016-03-06 (×6): 200 mL

## 2016-03-04 NOTE — Progress Notes (Signed)
Pharmacy Antibiotic Note  Tabitha Bryant is a 82 y.o. female admitted on 03/16/2016 with bacteremia.  Pharmacy has been consulted for cefepime and vancomycin dosing. Patient's renal function has worsened, will reduce cefepime dose and plan to get vancomycin level at steady state if continued.  Plan: Vancomycin 750mg  IV every 12 hours.  Goal trough 15-20 mcg/mL.  Change cefepime to 2g IV q24h Monitor culture data, renal function [facility]  Height: 5' 6.5" (168.9 cm) Weight: 192 lb 10.9 oz (87.4 kg) IBW/kg (Calculated) : 60.45  Temp (24hrs), Avg:98.7 F (37.1 C), Min:98.2 F (36.8 C), Max:99.2 F (37.3 C)   Recent Labs Lab 03/02/2016 1330  02/28/16 0350 02/29/16 0254 03/01/16 0425 03/01/16 2308 03/04/16 0500  WBC  --   < > 15.5* 8.7 11.3* 12.3* 22.4*  CREATININE  --   < > 1.48* 1.27* 1.09* 0.90 1.20*  LATICACIDVEN 5.87*  --   --   --   --   --   --   < > = values in this interval not displayed.  Estimated Creatinine Clearance: 37.9 mL/min (by C-G formula based on SCr of 1.2 mg/dL).    Allergies  Allergen Reactions  . Penicillins Other (See Comments)    Unknown Tolerated cefoxitin 02/27/16    Antimicrobials this admission: Cefepime 9/11 >>  Vanc 9/11 >>   Microbiology results: 9/10 BCx: GNR and GPC in pairs in clusters 9/10 UCx: neg  9/5 MRSA PCR: neg   Arlean Hoppingorey M. Newman PiesBall, PharmD, BCPS Clinical Pharmacist Pager [phone] 03/04/2016 8:50 AM

## 2016-03-04 NOTE — Progress Notes (Signed)
       Patient Name: Tabitha Bryant Date of Encounter: 03/04/2016    SUBJECTIVE: The patient remains intubated.  TELEMETRY:  Episodes of sinus bradycardia into the 40s were noted last p.m. and early this a.m. No atrial fibrillation. Vitals:   03/04/16 0700 03/04/16 0800 03/04/16 0822 03/04/16 1000  BP: (!) 151/53 (!) 158/56 (!) 158/56 (!) 147/52  Pulse: 96 97 87 87  Resp: (!) 24 (!) 29 (!) 28 (!) 25  Temp:  98.7 F (37.1 C)    TempSrc:  Axillary    SpO2: 95% 96% 98% 96%  Weight:      Height:        Intake/Output Summary (Last 24 hours) at 03/04/16 1158 Last data filed at 03/04/16 1000  Gross per 24 hour  Intake              640 ml  Output             2200 ml  Net            -1560 ml   LABS: Basic Metabolic Panel:  Recent Labs  16/03/9608/09/17 2308 03/04/16 0500  NA 146* 155*  K 2.2* 3.4*  CL 117* 118*  CO2 21* 28  GLUCOSE 135* 201*  BUN 39* 84*  CREATININE 0.90 1.20*  CALCIUM 6.2* 8.4*  MG 1.4*  --    CBC:  Recent Labs  03/01/16 2308 03/04/16 0500  WBC 12.3* 22.4*  HGB 10.8* 8.6*  HCT 33.0* 27.1*  MCV 90.2 92.8  PLT 189 250   Radiology/Studies:  No relevant cardiac data  Physical Exam: Blood pressure (!) 147/52, pulse 87, temperature 98.7 F (37.1 C), temperature source Axillary, resp. rate (!) 25, height 5' 6.5" (1.689 m), weight 192 lb 10.9 oz (87.4 kg), SpO2 96 %. Weight change:   Wt Readings from Last 3 Encounters:  02/29/16 192 lb 10.9 oz (87.4 kg)   Support apparatus. Anterior lung fields are clear. Regular rhythm  ASSESSMENT:  1. Paroxysmal atrial fibrillation has not recurred. Patient has been off diltiazem for 12 hours. 2. Transient episodes of bradycardia without hemodynamic compromise.  Plan:  1. Discontinue digoxin 2. Continue to observe for recurrence of tachyarrhythmias.  Signed, Lyn RecordsHenry W Smith III 03/04/2016, 11:58 AM
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
## 2016-03-04 NOTE — Progress Notes (Signed)
Speech Language Pathology Discharge Patient Details Name: Birdie HopesVertie P Casasola MRN: [id number] DOB: [birth year] Today's Date: 03/04/2016 Time:  -     Patient discharged from SLP services secondary to remains intubated. Please reorder if pt improves  Please see latest therapy progress note for current level of functioning and progress toward goals.    Progress and discharge plan discussed with patient and/or caregiver: Patient unable to participate in discharge planning and no caregivers available  GO     Royce MacadamiaLitaker, Alwilda Gilland Willis 03/04/2016, 7:54 AM    Breck CoonsLisa Willis Lonell FaceLitaker M.Ed [employer] Pager [phone]

## 2016-03-04 NOTE — Progress Notes (Signed)
Subjective: 7 Days Post-Op Procedure(s) (LRB): EXPLORATORY LAPAROTOMY (N/A) SMALL BOWEL RESECTION TIMES 2 OMENTECTOMY (N/A)  Patient is intubated.  She opens her eyes with response to her name, but unable to follow commands.  Objective:   VITALS:  Temp:  [97.5 F (36.4 C)-99 F (37.2 C)] 97.5 F (36.4 C) (09/12 1200) Pulse Rate:  [78-99] 97 (09/12 1600) Resp:  [17-31] 22 (09/12 1600) BP: (115-158)/(38-60) 154/52 (09/12 1600) SpO2:  [93 %-100 %] 97 % (09/12 1600) FiO2 (%):  [40 %-50 %] 40 % (09/12 1154)  P.E.  WDWN female intubated resting comfortably in bed.  Mechanical respiration.  L wrist splinted.  Edema to UE digits.  Mild edema to R supraclavicular region.  Radial pulses 2+  LABS  Recent Labs  03/01/16 2308 03/04/16 0500  HGB 10.8* 8.6*  WBC 12.3* 22.4*  PLT 189 250    Recent Labs  03/01/16 2308 03/04/16 0500  NA 146* 155*  K 2.2* 3.4*  CL 117* 118*  CO2 21* 28  BUN 39* 84*  CREATININE 0.90 1.20*  GLUCOSE 135* 201*   No results for input(s): LABPT, INR in the last 72 hours.   Assessment/Plan: 7 Days Post-Op Procedure(s) (LRB): EXPLORATORY LAPAROTOMY (N/A) SMALL BOWEL RESECTION TIMES 2 OMENTECTOMY (N/A)  R clavicle fracture - NWB R UE.  Sling for comfort  L distal radius nondisplaced fracture.  Splint.  F/u films in a week or 2.  No current surgical indication.  Ortho signing off until patient is extubated.  Alfredo MartinezJustin Darrel Gloss, PA-C, [facility] Office:  [phone]

## 2016-03-04 NOTE — Progress Notes (Signed)
Follow up - Trauma and Critical Care  Patient Details:    Tabitha Bryant is an 82 y.o. female.  Lines/tubes : Airway 7 mm (Active)  Secured at (cm) 22 cm 03/04/2016  8:22 AM  Measured From Lips 03/04/2016  8:22 AM  Secured Location Left 03/04/2016  8:22 AM  Secured By [employer] 03/04/2016  8:22 AM  Tube Holder Repositioned Yes 03/04/2016  8:22 AM  Cuff Pressure (cm H2O) 22 cm H2O 03/03/2016 11:50 PM  Site Condition Dry 03/04/2016  8:22 AM     CVC Triple Lumen 2016/03/25 (Active)  Indication for Insertion or Continuance of Line Vasoactive infusions;Prolonged intravenous therapies 03/03/2016  8:00 PM  Site Assessment Bleeding;Red;Other (Comment) 03/03/2016  8:00 PM  Proximal Lumen Status Other (Comment);Blood return noted 03/03/2016  8:00 PM  Medial Infusing;Blood return noted 03/03/2016  8:00 PM  Distal Lumen Status Infusing;Blood return noted 03/03/2016  8:00 PM  Dressing Type Transparent;Occlusive 03/04/2016 12:00 AM  Dressing Status Clean;Dry;Antimicrobial disc in place;Intact 03/04/2016 12:00 AM  Line Care Cap(s) changed;Connections checked and tightened;Line pulled back;Tubing changed 03/04/2016 12:00 AM  Dressing Intervention New dressing;Dressing changed;Antimicrobial disc changed 03/04/2016 12:00 AM  Dressing Change Due 03/06/2016 03/04/2016 12:00 AM     NG/OG Tube Nasogastric 14 Fr. Left nare (Active)  Site Assessment Clean;Dry;Intact 03/03/2016  8:00 PM  Ongoing Placement Verification Auscultation 03/03/2016  8:00 PM  Status Suction-low intermittent 03/03/2016  8:00 PM  Amount of suction 110 mmHg 03/03/2016  8:00 PM  Drainage Appearance Manson Passey;Coffee ground 03/03/2016  8:00 PM  Intake (mL) 30 mL 02/28/2016  8:00 PM  Output (mL) 400 mL 03/04/2016  6:00 AM     Urethral Catheter T.Gaye Pollack, RN Latex;Straight-tip 16 Fr. (Active)  Indication for Insertion or Continuance of Catheter Unstable critical patients (first 24-48 hours) 03/03/2016  8:00 PM  Site Assessment Clean;Intact 03/03/2016   8:00 PM  Catheter Maintenance Insertion date on drainage bag;Bag below level of bladder;Catheter secured;Drainage bag/tubing not touching floor;No dependent loops;Seal intact 03/03/2016  8:00 PM  Collection Container Standard drainage bag 03/03/2016  8:00 PM  Securement Method Securing device (Describe) 03/03/2016  8:00 PM  Urinary Catheter Interventions Unclamped 03/02/2016  8:00 PM  Output (mL) 245 mL 03/04/2016  9:03 AM    Microbiology/Sepsis markers: Results for orders placed or performed during the hospital encounter of 03-25-16  MRSA PCR Screening     Status: None   Collection Time: 2016-03-25  4:00 PM  Result Value Ref Range Status   MRSA by PCR NEGATIVE NEGATIVE Final    Comment:        The GeneXpert MRSA Assay (FDA approved for NASAL specimens only), is one component of a comprehensive MRSA colonization surveillance program. It is not intended to diagnose MRSA infection nor to guide or monitor treatment for MRSA infections.   Culture, Urine     Status: None   Collection Time: 03/02/16  5:42 AM  Result Value Ref Range Status   Specimen Description URINE, RANDOM  Final   Special Requests NONE  Final   Culture NO GROWTH  Final   Report Status 03/03/2016 FINAL  Final  Culture, blood (routine x 2)     Status: None (Preliminary result)   Collection Time: 03/02/16  9:27 AM  Result Value Ref Range Status   Specimen Description BLOOD RIGHT ARM  Final   Special Requests BOTTLES DRAWN AEROBIC AND ANAEROBIC 4CC  Final   Culture  Setup Time   Final    GRAM POSITIVE COCCI IN CLUSTERS  AEROBIC BOTTLE ONLY    Culture   Final    GRAM POSITIVE COCCI CULTURE REINCUBATED FOR BETTER GROWTH    Report Status PENDING  Incomplete  Culture, blood (routine x 2)     Status: None (Preliminary result)   Collection Time: 03/02/16  9:27 AM  Result Value Ref Range Status   Specimen Description BLOOD RIGHT ARM  Final   Special Requests IN PEDIATRIC BOTTLE 3CC  Final   Culture  Setup Time   Final     GRAM NEGATIVE RODS GRAM POSITIVE COCCI IN PAIRS IN CLUSTERS Organism ID to follow IN PEDIATRIC BOTTLE J. MARKLE, PHARM AT 0800 ON [id number] BY Lucienne Capers    Culture Romie Minus NEGATIVE RODS GRAM POSITIVE COCCI   Final   Report Status PENDING  Incomplete  Blood Culture ID Panel (Reflexed)     Status: Abnormal   Collection Time: 03/02/16  9:27 AM  Result Value Ref Range Status   Enterococcus species NOT DETECTED NOT DETECTED Final   Listeria monocytogenes NOT DETECTED NOT DETECTED Final   Staphylococcus species DETECTED (A) NOT DETECTED Final    Comment: CRITICAL RESULT CALLED TO, READ BACK BY AND VERIFIED WITH: J. MARKLE, PHARM AT 0800 ON [id number] BY S. YARBROUGH    Staphylococcus aureus NOT DETECTED NOT DETECTED Final   Methicillin resistance DETECTED (A) NOT DETECTED Final    Comment: CRITICAL RESULT CALLED TO, READ BACK BY AND VERIFIED WITH: J. MARKLE, PHARM AT 0800 ON [id number] BY S. YARBROUGH    Streptococcus species NOT DETECTED NOT DETECTED Final   Streptococcus agalactiae NOT DETECTED NOT DETECTED Final   Streptococcus pneumoniae NOT DETECTED NOT DETECTED Final   Streptococcus pyogenes NOT DETECTED NOT DETECTED Final   Acinetobacter baumannii NOT DETECTED NOT DETECTED Final   Enterobacteriaceae species DETECTED (A) NOT DETECTED Final    Comment: CRITICAL RESULT CALLED TO, READ BACK BY AND VERIFIED WITH: J. MARKLE, PHARM AT 0800 ON [id number] BY S. YARBROUGH    Enterobacter cloacae complex NOT DETECTED NOT DETECTED Final   Escherichia coli NOT DETECTED NOT DETECTED Final   Klebsiella oxytoca NOT DETECTED NOT DETECTED Final   Klebsiella pneumoniae NOT DETECTED NOT DETECTED Final   Proteus species NOT DETECTED NOT DETECTED Final   Serratia marcescens NOT DETECTED NOT DETECTED Final   Carbapenem resistance NOT DETECTED NOT DETECTED Final   Haemophilus influenzae NOT DETECTED NOT DETECTED Final   Neisseria meningitidis NOT DETECTED NOT DETECTED Final   Pseudomonas aeruginosa NOT DETECTED  NOT DETECTED Final   Candida albicans NOT DETECTED NOT DETECTED Final   Candida glabrata NOT DETECTED NOT DETECTED Final   Candida krusei NOT DETECTED NOT DETECTED Final   Candida parapsilosis NOT DETECTED NOT DETECTED Final   Candida tropicalis NOT DETECTED NOT DETECTED Final    Anti-infectives:  Anti-infectives    Start     Dose/Rate Route Frequency Ordered Stop   03/04/16 2000  ceFEPIme (MAXIPIME) 2 g in dextrose 5 % 50 mL IVPB     2 g 100 mL/hr over 30 Minutes Intravenous Every 24 hours 03/04/16 0852     03/03/16 2030  vancomycin (VANCOCIN) IVPB 750 mg/150 ml premix  Status:  Discontinued     750 mg 150 mL/hr over 60 Minutes Intravenous Every 12 hours 03/03/16 0829 03/04/16 1022   03/03/16 1630  ceFEPIme (MAXIPIME) 1 g in dextrose 5 % 50 mL IVPB  Status:  Discontinued     1 g 100 mL/hr over 30 Minutes Intravenous Every 8 hours 03/03/16 0829 03/04/16  1610   03/03/16 0900  vancomycin (VANCOCIN) 1,250 mg in sodium chloride 0.9 % 250 mL IVPB     1,250 mg 166.7 mL/hr over 90 Minutes Intravenous  Once 03/03/16 0823 03/03/16 1047   03/03/16 0900  ceFEPIme (MAXIPIME) 2 g in dextrose 5 % 50 mL IVPB     2 g 100 mL/hr over 30 Minutes Intravenous  Once 03/03/16 0823 03/03/16 1122   Mar 03, 2016 1415  cefOXitin (MEFOXIN) 1 g in dextrose 5 % 50 mL IVPB  Status:  Discontinued     1 g 100 mL/hr over 30 Minutes Intravenous Every 6 hours 03-03-2016 1410 Mar 03, 2016 1546      Best Practice/Protocols:  VTE Prophylaxis: Lovenox (prophylaxtic dose) and Mechanical GI Prophylaxis: Proton Pump Inhibitor Currently not getting any sedation  Consults: Treatment Team:  Toni Arthurs, MD Md Stroke, MD Rounding Lbcardiology, MD    Events:  Subjective:    Overnight Issues: Not moving her right side from recent stroke.  Objective:  Vital signs for last 24 hours: Temp:  [98.2 F (36.8 C)-99.2 F (37.3 C)] 98.7 F (37.1 C) (09/12 0800) Pulse Rate:  [78-108] 87 (09/12 1000) Resp:  [17-32] 25 (09/12  1000) BP: (115-159)/(38-57) 147/52 (09/12 1000) SpO2:  [92 %-100 %] 96 % (09/12 1000) FiO2 (%):  [40 %-50 %] 40 % (09/12 0822)  Hemodynamic parameters for last 24 hours:    Intake/Output from previous day: 09/11 0701 - 09/12 0700 In: 1065 [I.V.:65; IV Piggyback:1000] Out: 2155 [Urine:1155; Emesis/NG output:1000]  Intake/Output this shift: Total I/O In: 200 [IV Piggyback:200] Out: 245 [Urine:245]  Vent settings for last 24 hours: Vent Mode: CPAP;PSV FiO2 (%):  [40 %-50 %] 40 % Set Rate:  [14 bmp] 14 bmp Vt Set:  [460 mL-470 mL] 460 mL PEEP:  [5 cmH20] 5 cmH20 Pressure Support:  [5 cmH20] 5 cmH20 Plateau Pressure:  [14 cmH20-18 cmH20] 15 cmH20  Physical Exam:  General: alert and no respiratory distress Neuro: alert, RASS 0, weakness right upper extremity and weakness right lower extremity Resp: clear to auscultation bilaterally CVS: regular rate and rhythm, S1, S2 normal, no murmur, click, rub or gallop and Possible in and out of Afib with occasional bradycardia. GI: soft, nontender, BS WNL, no r/g and Has not been getting any tube feedings. Extremities: edema 2+  Results for orders placed or performed during the hospital encounter of 03-Mar-2016 (from the past 24 hour(s))  Glucose, capillary     Status: Abnormal   Collection Time: 03/03/16 11:28 AM  Result Value Ref Range   Glucose-Capillary 147 (H) 65 - 99 mg/dL   Comment 1 Notify RN    Comment 2 Document in Chart   Glucose, capillary     Status: Abnormal   Collection Time: 03/03/16  3:27 PM  Result Value Ref Range   Glucose-Capillary 151 (H) 65 - 99 mg/dL  Glucose, capillary     Status: Abnormal   Collection Time: 03/03/16  7:31 PM  Result Value Ref Range   Glucose-Capillary 135 (H) 65 - 99 mg/dL  Glucose, capillary     Status: Abnormal   Collection Time: 03/03/16 11:21 PM  Result Value Ref Range   Glucose-Capillary 169 (H) 65 - 99 mg/dL  Glucose, capillary     Status: Abnormal   Collection Time: 03/04/16  3:51 AM   Result Value Ref Range   Glucose-Capillary 155 (H) 65 - 99 mg/dL  CBC     Status: Abnormal   Collection Time: 03/04/16  5:00 AM  Result  Value Ref Range   WBC 22.4 (H) 4.0 - 10.5 K/uL   RBC 2.92 (L) 3.87 - 5.11 MIL/uL   Hemoglobin 8.6 (L) 12.0 - 15.0 g/dL   HCT 21.327.1 (L) 08.636.0 - 57.846.0 %   MCV 92.8 78.0 - 100.0 fL   MCH 29.5 26.0 - 34.0 pg   MCHC 31.7 30.0 - 36.0 g/dL   RDW 46.916.4 (H) 62.911.5 - 52.815.5 %   Platelets 250 150 - 400 K/uL  Basic metabolic panel     Status: Abnormal   Collection Time: 03/04/16  5:00 AM  Result Value Ref Range   Sodium 155 (H) 135 - 145 mmol/L   Potassium 3.4 (L) 3.5 - 5.1 mmol/L   Chloride 118 (H) 101 - 111 mmol/L   CO2 28 22 - 32 mmol/L   Glucose, Bld 201 (H) 65 - 99 mg/dL   BUN 84 (H) 6 - 20 mg/dL   Creatinine, Ser 4.131.20 (H) 0.44 - 1.00 mg/dL   Calcium 8.4 (L) 8.9 - 10.3 mg/dL   GFR calc non Af Amer 40 (L) >60 mL/min   GFR calc Af Amer 46 (L) >60 mL/min   Anion gap 9 5 - 15  Glucose, capillary     Status: Abnormal   Collection Time: 03/04/16  7:33 AM  Result Value Ref Range   Glucose-Capillary 161 (H) 65 - 99 mg/dL   Comment 1 Notify RN    Comment 2 Document in Chart      Assessment/Plan:   NEURO  Altered Mental Status:  agitation and rrecent CVA   Plan: WEaning as tolerated on the ventilator  PULM  Atelectasis/collapse (focal and LLL)   Plan: Functiionally she is doing fine with weaning going well.  Not safe to extubate  CARDIO  Apparently has had some bradycardia.  On Dig.  Will check level   Plan: Check Digoxin level, but should be able to discontinue dig per Cards.  RENAL  Urine outptut has been good, but renal function has vworsened. Actue Renal Failure (etiology unknown)   Plan: Will see if we need to get nephrology involved, but with output okay at this time will hold off.  GI  Bowel Trauma with SB resectiion   Plan: Try tube feedings.  Glucerna  ID  Positive blood cultures for GPC on Vanco, but renal functiion is much worse.  Also on  Cefepine.   Plan: DC Vanco.  May need ID consultation.    HEME  Anemia acute blood loss anemia, anemia of critical illness and anemia of renal disease) Leukocytosis (Unexpected leukocytosis)   Plan: CPM with antibiotics.  Cultures ID are pending.  ENDO Hypernatremia.  Not getting much IVFs.  Will start some free water and tube feedings.  Recheck tomorrow.   Plan: Free water, tube feedings.  Global Issues  IVF of 1/2 NS at 50.  Free water.  Discuss with the family long term plans.      LOS: 7 days   Additional comments:I reviewed the patient's new clinical lab test results. cbc/bmet and I reviewed the patients new imaging test results. cxr  Critical Care Total Time*: 45 Minutes  Dock Baccam 03/04/2016  *Care during the described time interval was provided by me and/or other providers on the critical care team.  I have reviewed this patient's available data, including medical history, events of note, physical examination and test results as part of my evaluation.
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
## 2016-03-05 ENCOUNTER — Inpatient Hospital Stay (HOSPITAL_COMMUNITY): Payer: Medicare Other

## 2016-03-05 DIAGNOSIS — E785 Hyperlipidemia, unspecified: Secondary | ICD-10-CM

## 2016-03-05 DIAGNOSIS — I495 Sick sinus syndrome: Secondary | ICD-10-CM

## 2016-03-05 LAB — BASIC METABOLIC PANEL
ANION GAP: 8 (ref 5–15)
BUN: 65 mg/dL — ABNORMAL HIGH (ref 6–20)
CHLORIDE: 121 mmol/L — AB (ref 101–111)
CO2: 28 mmol/L (ref 22–32)
Calcium: 8 mg/dL — ABNORMAL LOW (ref 8.9–10.3)
Creatinine, Ser: 1.09 mg/dL — ABNORMAL HIGH (ref 0.44–1.00)
GFR calc Af Amer: 52 mL/min — ABNORMAL LOW (ref >60)
GFR calc non Af Amer: 45 mL/min — ABNORMAL LOW (ref >60)
GLUCOSE: 170 mg/dL — AB (ref 65–99)
POTASSIUM: 3.2 mmol/L — AB (ref 3.5–5.1)
Sodium: 157 mmol/L — ABNORMAL HIGH (ref 135–145)

## 2016-03-05 LAB — CBC WITH DIFFERENTIAL/PLATELET
BASOS ABS: 0 10*3/uL (ref 0.0–0.1)
Basophils Relative: 0 %
Eosinophils Absolute: 0 10*3/uL (ref 0.0–0.7)
Eosinophils Relative: 0 %
HEMATOCRIT: 26.6 % — AB (ref 36.0–46.0)
Hemoglobin: 8.1 g/dL — ABNORMAL LOW (ref 12.0–15.0)
LYMPHS ABS: 2.3 10*3/uL (ref 0.7–4.0)
LYMPHS PCT: 12 %
MCH: 28.8 pg (ref 26.0–34.0)
MCHC: 30.5 g/dL (ref 30.0–36.0)
MCV: 94.7 fL (ref 78.0–100.0)
Monocytes Absolute: 0.9 10*3/uL (ref 0.1–1.0)
Monocytes Relative: 5 %
NEUTROS ABS: 16.4 10*3/uL — AB (ref 1.7–7.7)
Neutrophils Relative %: 83 %
Platelets: 283 10*3/uL (ref 150–400)
RBC: 2.81 MIL/uL — AB (ref 3.87–5.11)
RDW: 16.7 % — ABNORMAL HIGH (ref 11.5–15.5)
WBC: 19.6 10*3/uL — AB (ref 4.0–10.5)

## 2016-03-05 LAB — GLUCOSE, CAPILLARY
GLUCOSE-CAPILLARY: 138 mg/dL — AB (ref 65–99)
GLUCOSE-CAPILLARY: 158 mg/dL — AB (ref 65–99)
GLUCOSE-CAPILLARY: 153 mg/dL — AB (ref 65–99)
GLUCOSE-CAPILLARY: 153 mg/dL — AB (ref 65–99)
GLUCOSE-CAPILLARY: 230 mg/dL — AB (ref 65–99)
Glucose-Capillary: 171 mg/dL — ABNORMAL HIGH (ref 65–99)

## 2016-03-05 LAB — BLOOD GAS, ARTERIAL
Acid-Base Excess: 5 mmol/L — ABNORMAL HIGH (ref 0.0–2.0)
BICARBONATE: 28.2 mmol/L — AB (ref 20.0–28.0)
DRAWN BY: 235881
FIO2: 40
Mode: POSITIVE
O2 Saturation: 95.8 %
PATIENT TEMPERATURE: 98.2
PEEP: 5 cmH2O
Pressure support: 8 cmH2O
pCO2 arterial: 34.9 mmHg (ref 32.0–48.0)
pH, Arterial: 7.517 — ABNORMAL HIGH (ref 7.350–7.450)
pO2, Arterial: 71.4 mmHg — ABNORMAL LOW (ref 83.0–108.0)

## 2016-03-05 MED ORDER — INSULIN DETEMIR 100 UNIT/ML ~~LOC~~ SOLN
5.0000 [IU] | Freq: Two times a day (BID) | SUBCUTANEOUS | Status: DC
  Administered 2016-03-05 – 2016-03-07 (×6): 5 [IU] via SUBCUTANEOUS
  Filled 2016-03-05 (×8): qty 0.05

## 2016-03-05 MED ORDER — PIVOT 1.5 CAL PO LIQD
1000.0000 mL | ORAL | Status: DC
  Administered 2016-03-05: 1000 mL

## 2016-03-05 MED ORDER — PRO-STAT SUGAR FREE PO LIQD
30.0000 mL | Freq: Three times a day (TID) | ORAL | Status: DC
  Administered 2016-03-05 – 2016-03-07 (×5): 30 mL
  Filled 2016-03-05 (×6): qty 30

## 2016-03-05 MED ORDER — ASPIRIN 325 MG PO TABS
ORAL_TABLET | ORAL | Status: AC
  Filled 2016-03-05: qty 1

## 2016-03-05 MED ORDER — POTASSIUM CHLORIDE 10 MEQ/50ML IV SOLN
10.0000 meq | INTRAVENOUS | Status: AC
  Administered 2016-03-05 (×4): 10 meq via INTRAVENOUS
  Filled 2016-03-05 (×4): qty 50

## 2016-03-05 MED ORDER — ASPIRIN 325 MG PO TABS
325.0000 mg | ORAL_TABLET | Freq: Every day | ORAL | Status: DC
  Administered 2016-03-05 – 2016-03-07 (×3): 325 mg via ORAL
  Filled 2016-03-05: qty 1

## 2016-03-05 MED ORDER — DEXTROSE 5 % IV SOLN
INTRAVENOUS | Status: DC
  Administered 2016-03-05 – 2016-03-08 (×4): via INTRAVENOUS

## 2016-03-05 MED ORDER — ACETAMINOPHEN 160 MG/5ML PO SOLN
650.0000 mg | Freq: Four times a day (QID) | ORAL | Status: DC | PRN
  Administered 2016-03-05: 650 mg via ORAL
  Filled 2016-03-05: qty 20.3

## 2016-03-05 NOTE — Care Management Important Message (Signed)
Important Message  Patient Details  Name: Birdie HopesVertie P Penton MRN: [id number] Date of Birth: 1928-04-25   Medicare Important Message Given:  Yes    Kyla BalzarineShealy, Derron Pipkins Abena 03/05/2016, 11:39 AM
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
## 2016-03-05 NOTE — Progress Notes (Signed)
Follow up - Trauma and Critical Care  Patient Details:    Tabitha Bryant is an 82 y.o. female.  Lines/tubes : Airway 7 mm (Active)  Secured at (cm) 22 cm 03/05/2016  7:35 AM  Measured From Lips 03/05/2016  7:35 AM  Secured Location Center 03/05/2016  7:35 AM  Secured By [employer] 03/05/2016  7:35 AM  Tube Holder Repositioned Yes 03/05/2016  7:31 AM  Cuff Pressure (cm H2O) 23 cm H2O 03/05/2016  7:31 AM  Site Condition Dry 03/05/2016  7:35 AM     CVC Triple Lumen 03/12/2016 (Active)  Indication for Insertion or Continuance of Line Vasoactive infusions 03/04/2016  8:00 PM  Site Assessment Dry;Clean;Intact 03/04/2016  8:00 PM  Proximal Lumen Status Infusing;Flushed 03/04/2016  8:00 PM  Medial Infusing;Flushed 03/04/2016  8:00 PM  Distal Lumen Status Saline locked 03/04/2016  8:00 PM  Dressing Type Transparent 03/04/2016  8:00 PM  Dressing Status Clean;Dry;Antimicrobial disc in place;Intact 03/04/2016  8:00 PM  Line Care Line pulled back;Connections checked and tightened 03/04/2016  8:00 PM  Dressing Intervention New dressing;Dressing changed;Antimicrobial disc changed 03/04/2016 12:00 AM  Dressing Change Due 03/15/2016 03/04/2016  8:00 PM     NG/OG Tube Nasogastric 14 Fr. Left nare (Active)  Site Assessment Clean;Dry;Intact 03/04/2016  8:00 PM  Ongoing Placement Verification Auscultation 03/04/2016  8:00 PM  Status Infusing tube feed 03/04/2016  8:00 PM  Amount of suction 110 mmHg 03/04/2016  8:00 AM  Drainage Appearance Manson Passey;Coffee ground 03/04/2016  8:00 AM  Intake (mL) 30 mL 02/28/2016  8:00 PM  Output (mL) 350 mL 03/04/2016  3:00 PM     Urethral Catheter T.Gaye Pollack, RN Latex;Straight-tip 16 Fr. (Active)  Indication for Insertion or Continuance of Catheter Unstable critical patients (first 24-48 hours) 03/04/2016  8:00 PM  Site Assessment Clean;Intact 03/04/2016  8:00 PM  Catheter Maintenance Insertion date on drainage bag;Bag below level of bladder;No dependent loops;Seal intact;Drainage  bag/tubing not touching floor;Catheter secured 03/04/2016  8:00 PM  Collection Container Standard drainage bag 03/04/2016  8:00 PM  Securement Method Securing device (Describe) 03/04/2016  8:00 PM  Urinary Catheter Interventions Unclamped 03/04/2016  8:00 AM  Output (mL) 85 mL 03/05/2016  8:18 AM    Microbiology/Sepsis markers: Results for orders placed or performed during the hospital encounter of 03/01/2016  MRSA PCR Screening     Status: None   Collection Time: 03/10/2016  4:00 PM  Result Value Ref Range Status   MRSA by PCR NEGATIVE NEGATIVE Final    Comment:        The GeneXpert MRSA Assay (FDA approved for NASAL specimens only), is one component of a comprehensive MRSA colonization surveillance program. It is not intended to diagnose MRSA infection nor to guide or monitor treatment for MRSA infections.   Culture, Urine     Status: None   Collection Time: 03/02/16  5:42 AM  Result Value Ref Range Status   Specimen Description URINE, RANDOM  Final   Special Requests NONE  Final   Culture NO GROWTH  Final   Report Status 03/03/2016 FINAL  Final  Culture, blood (routine x 2)     Status: None (Preliminary result)   Collection Time: 03/02/16  9:27 AM  Result Value Ref Range Status   Specimen Description BLOOD RIGHT ARM  Final   Special Requests BOTTLES DRAWN AEROBIC AND ANAEROBIC 4CC  Final   Culture  Setup Time   Final    GRAM POSITIVE COCCI IN CLUSTERS AEROBIC BOTTLE ONLY  Culture   Final    GRAM POSITIVE COCCI CULTURE REINCUBATED FOR BETTER GROWTH    Report Status PENDING  Incomplete  Culture, blood (routine x 2)     Status: None (Preliminary result)   Collection Time: 03/02/16  9:27 AM  Result Value Ref Range Status   Specimen Description BLOOD RIGHT ARM  Final   Special Requests IN PEDIATRIC BOTTLE 3CC  Final   Culture  Setup Time   Final    GRAM NEGATIVE RODS GRAM POSITIVE COCCI IN PAIRS IN CLUSTERS Organism ID to follow IN PEDIATRIC BOTTLE J. MARKLE, PHARM AT  0800 ON [id number] BY Lucienne Capers    Culture   Final    Romie Minus NEGATIVE RODS GRAM POSITIVE COCCI SUBBING CULTURE FOR ISOLATION    Report Status PENDING  Incomplete  Blood Culture ID Panel (Reflexed)     Status: Abnormal   Collection Time: 03/02/16  9:27 AM  Result Value Ref Range Status   Enterococcus species NOT DETECTED NOT DETECTED Final   Listeria monocytogenes NOT DETECTED NOT DETECTED Final   Staphylococcus species DETECTED (A) NOT DETECTED Final    Comment: CRITICAL RESULT CALLED TO, READ BACK BY AND VERIFIED WITH: J. MARKLE, PHARM AT 0800 ON [id number] BY S. YARBROUGH    Staphylococcus aureus NOT DETECTED NOT DETECTED Final   Methicillin resistance DETECTED (A) NOT DETECTED Final    Comment: CRITICAL RESULT CALLED TO, READ BACK BY AND VERIFIED WITH: J. MARKLE, PHARM AT 0800 ON [id number] BY S. YARBROUGH    Streptococcus species NOT DETECTED NOT DETECTED Final   Streptococcus agalactiae NOT DETECTED NOT DETECTED Final   Streptococcus pneumoniae NOT DETECTED NOT DETECTED Final   Streptococcus pyogenes NOT DETECTED NOT DETECTED Final   Acinetobacter baumannii NOT DETECTED NOT DETECTED Final   Enterobacteriaceae species DETECTED (A) NOT DETECTED Final    Comment: CRITICAL RESULT CALLED TO, READ BACK BY AND VERIFIED WITH: J. MARKLE, PHARM AT 0800 ON [id number] BY S. YARBROUGH    Enterobacter cloacae complex NOT DETECTED NOT DETECTED Final   Escherichia coli NOT DETECTED NOT DETECTED Final   Klebsiella oxytoca NOT DETECTED NOT DETECTED Final   Klebsiella pneumoniae NOT DETECTED NOT DETECTED Final   Proteus species NOT DETECTED NOT DETECTED Final   Serratia marcescens NOT DETECTED NOT DETECTED Final   Carbapenem resistance NOT DETECTED NOT DETECTED Final   Haemophilus influenzae NOT DETECTED NOT DETECTED Final   Neisseria meningitidis NOT DETECTED NOT DETECTED Final   Pseudomonas aeruginosa NOT DETECTED NOT DETECTED Final   Candida albicans NOT DETECTED NOT DETECTED Final   Candida glabrata  NOT DETECTED NOT DETECTED Final   Candida krusei NOT DETECTED NOT DETECTED Final   Candida parapsilosis NOT DETECTED NOT DETECTED Final   Candida tropicalis NOT DETECTED NOT DETECTED Final    Anti-infectives:  Anti-infectives    Start     Dose/Rate Route Frequency Ordered Stop   03/04/16 2000  ceFEPIme (MAXIPIME) 2 g in dextrose 5 % 50 mL IVPB     2 g 100 mL/hr over 30 Minutes Intravenous Every 24 hours 03/04/16 0852     03/03/16 2030  vancomycin (VANCOCIN) IVPB 750 mg/150 ml premix  Status:  Discontinued     750 mg 150 mL/hr over 60 Minutes Intravenous Every 12 hours 03/03/16 0829 03/04/16 1022   03/03/16 1630  ceFEPIme (MAXIPIME) 1 g in dextrose 5 % 50 mL IVPB  Status:  Discontinued     1 g 100 mL/hr over 30 Minutes Intravenous Every 8 hours 03/03/16  [id number] 03/04/16 0852   03/03/16 0900  vancomycin (VANCOCIN) 1,250 mg in sodium chloride 0.9 % 250 mL IVPB     1,250 mg 166.7 mL/hr over 90 Minutes Intravenous  Once 03/03/16 0823 03/03/16 1047   03/03/16 0900  ceFEPIme (MAXIPIME) 2 g in dextrose 5 % 50 mL IVPB     2 g 100 mL/hr over 30 Minutes Intravenous  Once 03/03/16 0823 03/03/16 1122   24-Jan-2016 1415  cefOXitin (MEFOXIN) 1 g in dextrose 5 % 50 mL IVPB  Status:  Discontinued     1 g 100 mL/hr over 30 Minutes Intravenous Every 6 hours 24-Jan-2016 1410 24-Jan-2016 1546      Best Practice/Protocols:  VTE Prophylaxis: Lovenox (prophylaxtic dose) and Mechanical GI Prophylaxis: Proton Pump Inhibitor Continous Sedation  Consults: Treatment Team:  Toni ArthursJohn Hewitt, MD Md Stroke, MD [facility], MD    Events:  Subjective:    Overnight Issues: Even with sedation the patient seems to be more alert than yesterday.  Still weak on the right side.  Objective:  Vital signs for last 24 hours: Temp:  [97.5 F (36.4 C)-99.6 F (37.6 C)] 98.2 F (36.8 C) (09/13 0800) Pulse Rate:  [73-107] 85 (09/13 0900) Resp:  [20-31] 28 (09/13 0900) BP: (101-156)/(42-60) 134/43 (09/13  0900) SpO2:  [94 %-100 %] 100 % (09/13 0900) FiO2 (%):  [40 %] 40 % (09/13 0735)  Hemodynamic parameters for last 24 hours:    Intake/Output from previous day: 09/12 0701 - 09/13 0700 In: 1892.7 [I.V.:1338.3; NG/GT:154.3; IV Piggyback:400] Out: 2105 [Urine:1755; Emesis/NG output:350]  Intake/Output this shift: Total I/O In: -  Out: 85 [Urine:85]  Vent settings for last 24 hours: Vent Mode: CPAP;PSV FiO2 (%):  [40 %] 40 % Set Rate:  [14 bmp] 14 bmp Vt Set:  [460 mL] 460 mL PEEP:  [5 cmH20] 5 cmH20 Pressure Support:  [5 cmH20-8 cmH20] 8 cmH20 Plateau Pressure:  [12 cmH20-19 cmH20] 12 cmH20  Physical Exam:  General: alert, no respiratory distress and seems to be connected with environment Neuro: alert, RASS 0, weakness right upper extremity and weakness right lower extremity Resp: clear to auscultation bilaterally CVS: regular rate and rhythm, S1, S2 normal, no murmur, click, rub or gallop GI: soft, nontender, BS WNL, no r/g and seems to be tolerating tube feedings well at trickle rate.  Will increase to goal Extremities: edema 1+  Results for orders placed or performed during the hospital encounter of 24-Jan-2016 (from the past 24 hour(s))  Glucose, capillary     Status: Abnormal   Collection Time: 03/04/16 11:38 AM  Result Value Ref Range   Glucose-Capillary 165 (H) 65 - 99 mg/dL  Glucose, capillary     Status: Abnormal   Collection Time: 03/04/16  4:49 PM  Result Value Ref Range   Glucose-Capillary 181 (H) 65 - 99 mg/dL  Glucose, capillary     Status: Abnormal   Collection Time: 03/04/16  7:29 PM  Result Value Ref Range   Glucose-Capillary 118 (H) 65 - 99 mg/dL  Glucose, capillary     Status: Abnormal   Collection Time: 03/04/16 11:04 PM  Result Value Ref Range   Glucose-Capillary 201 (H) 65 - 99 mg/dL  Glucose, capillary     Status: Abnormal   Collection Time: 03/05/16  3:10 AM  Result Value Ref Range   Glucose-Capillary 138 (H) 65 - 99 mg/dL  CBC with  Differential/Platelet     Status: Abnormal   Collection Time: 03/05/16  4:45 AM  Result Value  Ref Range   WBC 19.6 (H) 4.0 - 10.5 K/uL   RBC 2.81 (L) 3.87 - 5.11 MIL/uL   Hemoglobin 8.1 (L) 12.0 - 15.0 g/dL   HCT 16.1 (L) 09.6 - 04.5 %   MCV 94.7 78.0 - 100.0 fL   MCH 28.8 26.0 - 34.0 pg   MCHC 30.5 30.0 - 36.0 g/dL   RDW 40.9 (H) 81.1 - 91.4 %   Platelets 283 150 - 400 K/uL   Neutrophils Relative % 83 %   Neutro Abs 16.4 (H) 1.7 - 7.7 K/uL   Lymphocytes Relative 12 %   Lymphs Abs 2.3 0.7 - 4.0 K/uL   Monocytes Relative 5 %   Monocytes Absolute 0.9 0.1 - 1.0 K/uL   Eosinophils Relative 0 %   Eosinophils Absolute 0.0 0.0 - 0.7 K/uL   Basophils Relative 0 %   Basophils Absolute 0.0 0.0 - 0.1 K/uL  Basic metabolic panel     Status: Abnormal   Collection Time: 03/05/16  4:45 AM  Result Value Ref Range   Sodium 157 (H) 135 - 145 mmol/L   Potassium 3.2 (L) 3.5 - 5.1 mmol/L   Chloride 121 (H) 101 - 111 mmol/L   CO2 28 22 - 32 mmol/L   Glucose, Bld 170 (H) 65 - 99 mg/dL   BUN 65 (H) 6 - 20 mg/dL   Creatinine, Ser 7.82 (H) 0.44 - 1.00 mg/dL   Calcium 8.0 (L) 8.9 - 10.3 mg/dL   GFR calc non Af Amer 45 (L) >60 mL/min   GFR calc Af Amer 52 (L) >60 mL/min   Anion gap 8 5 - 15  Glucose, capillary     Status: Abnormal   Collection Time: 03/05/16  7:36 AM  Result Value Ref Range   Glucose-Capillary 158 (H) 65 - 99 mg/dL   Comment 1 Notify RN    Comment 2 Document in Chart      Assessment/Plan:   NEURO  Altered Mental Status:  delirium, sedation and may just be a function of recent CVA   Plan: Continue to try to wean on the ventilator and back off on the sedation.  PULM  Atelectasis/collapse (focal and mild LLL)   Plan: Continue support  CARDIO  No specific issues   Plan: CPM  RENAL  Urine output is adequate and renal function seems to be improving   Plan: CPM  GI  Bowel Trauma with resection   Plan: Tolerated tube feedings yesterday.  Will increase to goal per dietician.   Also, wants to change to Pivot.  Will need to watch serum glucose levels.  ID  positive blood cultures, still on antibiotics.   Plan: CPM  HEME  Anemia acute blood loss anemia and anemia of critical illness)   Plan: hemoglobin down to 8.1, but platelets are increasing.  No blood to be given currently.  ENDO Hyperglycemia (diabetic and gett tube feeding.s)   Plan: Will add levemir maintenance   Global Issues      LOS: 8 days   Additional comments:I reviewed the patient's new clinical lab test results. cbc/bmet and I reviewed the patients new imaging test results. cxr  Critical Care Total Time*: 30 minutes of critical care management and evaluation  Sherhonda Gaspar 03/05/2016  *Care during the described time interval was provided by me and/or other providers on the critical care team.  I have reviewed this patient's available data, including medical history, events of note, physical examination and test results as part of my evaluation.
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
## 2016-03-05 NOTE — Progress Notes (Deleted)
       Patient Name: Tabitha MulliganVertie P Sherley Date of Encounter: 03/05/2016    SUBJECTIVE: Still intubated. Not very arousable.  TELEMETRY:  Sinus rhythm with intermittent significant sinus bradycardia. No recurrent atrial fibrillation within the past 24 hours. Vitals:   03/05/16 0735 03/05/16 0800 03/05/16 0900 03/05/16 1000  BP: (!) 101/44 (!) 146/49 (!) 134/43 (!) 139/43  Pulse: 87 73 85 87  Resp: (!) 28 20 (!) 28 (!) 23  Temp:  98.2 F (36.8 C)    TempSrc:  Axillary    SpO2: 100% 100% 100% 100%  Weight:      Height:        Intake/Output Summary (Last 24 hours) at 03/05/16 1037 Last data filed at 03/05/16 0818  Gross per 24 hour  Intake          1692.66 ml  Output             1945 ml  Net          -252.34 ml   LABS: Basic Metabolic Panel:  Recent Labs  78/29/5608/06/08 0500 03/05/16 0445  NA 155* 157*  K 3.4* 3.2*  CL 118* 121*  CO2 28 28  GLUCOSE 201* 170*  BUN 84* 65*  CREATININE 1.20* 1.09*  CALCIUM 8.4* 8.0*   CBC:  Recent Labs  03/04/16 0500 03/05/16 0445  WBC 22.4* 19.6*  NEUTROABS  --  16.4*  HGB 8.6* 8.1*  HCT 27.1* 26.6*  MCV 92.8 94.7  PLT 250 283    Physical Exam: Blood pressure (!) 139/43, pulse 87, temperature 98.2 F (36.8 C), temperature source Axillary, resp. rate (!) 23, height 5' 6.5" (1.689 m), weight 192 lb 10.9 oz (87.4 kg), SpO2 100 %. Weight change:   Wt Readings from Last 3 Encounters:  02/29/16 192 lb 10.9 oz (87.4 kg)   Intubated Sinus rhythm with occasional heart rates as low as 45.  ASSESSMENT:  1. Tachycardia/bradycardia syndrome. The atrial fibrillation episodes or at least in part precipitated by stress. Because of bradycardia, digoxin was discontinued.  Plan:  Continue clinical observation off cardioactive therapy.  Signed, Lyn RecordsHenry W Smith III 03/05/2016, 10:37 AM

## 2016-03-06 DIAGNOSIS — I6302 Cerebral infarction due to thrombosis of basilar artery: Secondary | ICD-10-CM

## 2016-03-06 LAB — BASIC METABOLIC PANEL
ANION GAP: 3 — AB (ref 5–15)
BUN: 54 mg/dL — AB (ref 6–20)
CALCIUM: 7.5 mg/dL — AB (ref 8.9–10.3)
CO2: 25 mmol/L (ref 22–32)
Chloride: 129 mmol/L — ABNORMAL HIGH (ref 101–111)
Creatinine, Ser: 1.01 mg/dL — ABNORMAL HIGH (ref 0.44–1.00)
GFR calc Af Amer: 57 mL/min — ABNORMAL LOW (ref >60)
GFR calc non Af Amer: 49 mL/min — ABNORMAL LOW (ref >60)
GLUCOSE: 223 mg/dL — AB (ref 65–99)
Potassium: 3.6 mmol/L (ref 3.5–5.1)
Sodium: 157 mmol/L — ABNORMAL HIGH (ref 135–145)

## 2016-03-06 LAB — CBC WITH DIFFERENTIAL/PLATELET
BASOS PCT: 0 %
Basophils Absolute: 0 10*3/uL (ref 0.0–0.1)
Basophils Absolute: 0 10*3/uL (ref 0.0–0.1)
Basophils Relative: 0 %
EOS ABS: 0 10*3/uL (ref 0.0–0.7)
EOS PCT: 0 %
Eosinophils Absolute: 0.1 10*3/uL (ref 0.0–0.7)
Eosinophils Relative: 0 %
HEMATOCRIT: 24.4 % — AB (ref 36.0–46.0)
HEMATOCRIT: 25.1 % — AB (ref 36.0–46.0)
HEMOGLOBIN: 7.5 g/dL — AB (ref 12.0–15.0)
Hemoglobin: 7.7 g/dL — ABNORMAL LOW (ref 12.0–15.0)
LYMPHS ABS: 2.3 10*3/uL (ref 0.7–4.0)
LYMPHS PCT: 12 %
Lymphocytes Relative: 11 %
Lymphs Abs: 2.1 10*3/uL (ref 0.7–4.0)
MCH: 29.3 pg (ref 26.0–34.0)
MCH: 29.7 pg (ref 26.0–34.0)
MCHC: 30.7 g/dL (ref 30.0–36.0)
MCHC: 30.7 g/dL (ref 30.0–36.0)
MCV: 95.3 fL (ref 78.0–100.0)
MCV: 96.9 fL (ref 78.0–100.0)
MONO ABS: 1 10*3/uL (ref 0.1–1.0)
MONOS PCT: 4 %
MONOS PCT: 5 %
Monocytes Absolute: 0.8 10*3/uL (ref 0.1–1.0)
NEUTROS ABS: 16 10*3/uL — AB (ref 1.7–7.7)
NEUTROS ABS: 16.2 10*3/uL — AB (ref 1.7–7.7)
Neutrophils Relative %: 84 %
Neutrophils Relative %: 84 %
PLATELETS: 345 10*3/uL (ref 150–400)
Platelets: 324 10*3/uL (ref 150–400)
RBC: 2.56 MIL/uL — ABNORMAL LOW (ref 3.87–5.11)
RBC: 2.59 MIL/uL — AB (ref 3.87–5.11)
RDW: 17.3 % — AB (ref 11.5–15.5)
RDW: 17.6 % — ABNORMAL HIGH (ref 11.5–15.5)
WBC: 19.1 10*3/uL — ABNORMAL HIGH (ref 4.0–10.5)
WBC: 19.3 10*3/uL — ABNORMAL HIGH (ref 4.0–10.5)

## 2016-03-06 LAB — GLUCOSE, CAPILLARY
GLUCOSE-CAPILLARY: 133 mg/dL — AB (ref 65–99)
GLUCOSE-CAPILLARY: 125 mg/dL — AB (ref 65–99)
Glucose-Capillary: 209 mg/dL — ABNORMAL HIGH (ref 65–99)
Glucose-Capillary: 196 mg/dL — ABNORMAL HIGH (ref 65–99)
Glucose-Capillary: 164 mg/dL — ABNORMAL HIGH (ref 65–99)
Glucose-Capillary: 133 mg/dL — ABNORMAL HIGH (ref 65–99)

## 2016-03-06 LAB — CULTURE, BLOOD (ROUTINE X 2)

## 2016-03-06 MED ORDER — LORAZEPAM 2 MG/ML IJ SOLN: 0.5000 mg | INTRAMUSCULAR | Status: DC | PRN

## 2016-03-06 MED ORDER — RACEPINEPHRINE HCL 2.25 % IN NEBU
INHALATION_SOLUTION | RESPIRATORY_TRACT | Status: AC
  Filled 2016-03-06: qty 0.5

## 2016-03-06 MED ORDER — RACEPINEPHRINE HCL 2.25 % IN NEBU
0.5000 mL | INHALATION_SOLUTION | Freq: Once | RESPIRATORY_TRACT | Status: AC
  Administered 2016-03-06: 0.5 mL via RESPIRATORY_TRACT

## 2016-03-06 NOTE — Progress Notes (Signed)
   Just extubated within the past 30 minutes. Increased respiratory distress.  Heart rate 130 bpm, sinus tachycardia.  Primary team deciding whether reintubation versus comfort care. No arrhythmias or other issues requiring cardiology help at this time. Please call if we may help further.

## 2016-03-06 NOTE — Procedures (Signed)
Extubation Procedure Note  Patient Details:   Name: Tabitha HopesVertie P Bryant DOB: [birth year] MRN: [id number]   Airway Documentation:     Evaluation  O2 sats: stable throughout Complications: No apparent complications Patient did tolerate procedure well. Bilateral Breath Sounds: Clear, Diminished   No  Patient tolerated wean. NIF -19 and VC 0.4L, MD ordered to extubate. Positive for cuff leak. Patient extubated to a 40% venti-mask. No stridor noted post extubation. Patient remains tachypneic. MD aware. Patient very rhonchus. RN at bedside.   Ancil BoozerSmallwood, Nastacia Raybuck 03/06/2016, 11:15AM

## 2016-03-06 NOTE — Progress Notes (Signed)
Follow up - Trauma and Critical Care  Patient Details:    Tabitha Bryant is an 82 y.o. female.  Lines/tubes : Airway 7 mm (Active)  Secured at (cm) 22 cm 03/06/2016  7:26 AM  Measured From Lips 03/06/2016  7:26 AM  Secured Location Left 03/06/2016  7:26 AM  Secured By [employer] Tube Holder 03/06/2016  7:26 AM  Tube Holder Repositioned Yes 03/06/2016  7:26 AM  Cuff Pressure (cm H2O) 26 cm H2O 03/06/2016  3:14 AM  Site Condition Dry 03/06/2016  7:26 AM     CVC Triple Lumen 09/19/2015 (Active)  Indication for Insertion or Continuance of Line Vasoactive infusions 03/05/2016  8:00 PM  Site Assessment Dry;Clean;Intact 03/05/2016  8:00 PM  Proximal Lumen Status Infusing;Flushed;No blood return 03/05/2016  8:00 PM  Medial Capped (Central line) 03/05/2016  8:00 PM  Distal Lumen Status Infusing;Flushed;Blood return noted 03/05/2016  8:00 PM  Dressing Type Transparent 03/05/2016  8:00 PM  Dressing Status Clean;Dry;Antimicrobial disc in place;Intact 03/05/2016  8:00 PM  Line Care Connections checked and tightened 03/05/2016  8:00 PM  Dressing Intervention New dressing;Dressing changed;Antimicrobial disc changed 03/04/2016 12:00 AM  Dressing Change Due 03/03/2016 03/05/2016  8:00 PM     NG/OG Tube Nasogastric 14 Fr. Left nare (Active)  Site Assessment Clean;Dry;Intact 03/05/2016  8:00 PM  Ongoing Placement Verification Auscultation 03/05/2016  8:00 PM  Status Infusing tube feed 03/05/2016  8:00 PM  Amount of suction 110 mmHg 03/04/2016  8:00 AM  Drainage Appearance Manson PasseyBrown;Coffee ground 03/04/2016  8:00 AM  Intake (mL) 30 mL 02/28/2016  8:00 PM  Output (mL) 350 mL 03/04/2016  3:00 PM     Urethral Catheter T.Gaye PollackSinclair, RN Latex;Straight-tip 16 Fr. (Active)  Indication for Insertion or Continuance of Catheter Unstable critical patients (first 24-48 hours) 03/06/2016  8:00 AM  Site Assessment Clean;Intact 03/05/2016  8:00 PM  Catheter Maintenance Bag below level of bladder;Catheter secured;Drainage bag/tubing not touching  floor;Insertion date on drainage bag;No dependent loops;Seal intact;Bag emptied prior to transport 03/06/2016  8:00 AM  Collection Container Standard drainage bag 03/05/2016  8:00 PM  Securement Method Securing device (Describe) 03/05/2016  8:00 PM  Urinary Catheter Interventions Unclamped 03/04/2016  8:00 AM  Output (mL) 200 mL 03/06/2016 10:00 AM    Microbiology/Sepsis markers: Results for orders placed or performed during the hospital encounter of 09/19/2015  MRSA PCR Screening     Status: None   Collection Time: 09/19/2015  4:00 PM  Result Value Ref Range Status   MRSA by PCR NEGATIVE NEGATIVE Final    Comment:        The GeneXpert MRSA Assay (FDA approved for NASAL specimens only), is one component of a comprehensive MRSA colonization surveillance program. It is not intended to diagnose MRSA infection nor to guide or monitor treatment for MRSA infections.   Culture, Urine     Status: None   Collection Time: 03/02/16  5:42 AM  Result Value Ref Range Status   Specimen Description URINE, RANDOM  Final   Special Requests NONE  Final   Culture NO GROWTH  Final   Report Status 03/03/2016 FINAL  Final  Culture, blood (routine x 2)     Status: Abnormal   Collection Time: 03/02/16  9:27 AM  Result Value Ref Range Status   Specimen Description BLOOD RIGHT ARM  Final   Special Requests BOTTLES DRAWN AEROBIC AND ANAEROBIC 4CC  Final   Culture  Setup Time   Final    GRAM POSITIVE COCCI IN CLUSTERS AEROBIC  BOTTLE ONLY    Culture STAPHYLOCOCCUS SPECIES (COAGULASE NEGATIVE) (A)  Final   Report Status 03/06/2016 FINAL  Final   Organism ID, Bacteria STAPHYLOCOCCUS SPECIES (COAGULASE NEGATIVE)  Final      Susceptibility   Staphylococcus species (coagulase negative) - MIC*    CIPROFLOXACIN <=0.5 SENSITIVE Sensitive     ERYTHROMYCIN >=8 RESISTANT Resistant     GENTAMICIN <=0.5 SENSITIVE Sensitive     OXACILLIN <=0.25 SENSITIVE Sensitive     TETRACYCLINE >=16 RESISTANT Resistant      VANCOMYCIN 1 SENSITIVE Sensitive     TRIMETH/SULFA <=10 SENSITIVE Sensitive     CLINDAMYCIN <=0.25 SENSITIVE Sensitive     RIFAMPIN <=0.5 SENSITIVE Sensitive     Inducible Clindamycin NEGATIVE Sensitive     * STAPHYLOCOCCUS SPECIES (COAGULASE NEGATIVE)  Culture, blood (routine x 2)     Status: None (Preliminary result)   Collection Time: 03/02/16  9:27 AM  Result Value Ref Range Status   Specimen Description BLOOD RIGHT ARM  Final   Special Requests IN PEDIATRIC BOTTLE 3CC  Final   Culture  Setup Time   Final    GRAM NEGATIVE RODS GRAM POSITIVE COCCI IN PAIRS IN CLUSTERS Organism ID to follow IN PEDIATRIC BOTTLE J. MARKLE, PHARM AT 0800 ON [id number] BY Lucienne Capers    Culture   Final    Romie Minus NEGATIVE RODS GRAM POSITIVE COCCI SUBBING CULTURE FOR ISOLATION    Report Status PENDING  Incomplete  Blood Culture ID Panel (Reflexed)     Status: Abnormal   Collection Time: 03/02/16  9:27 AM  Result Value Ref Range Status   Enterococcus species NOT DETECTED NOT DETECTED Final   Listeria monocytogenes NOT DETECTED NOT DETECTED Final   Staphylococcus species DETECTED (A) NOT DETECTED Final    Comment: CRITICAL RESULT CALLED TO, READ BACK BY AND VERIFIED WITH: J. MARKLE, PHARM AT 0800 ON [id number] BY S. YARBROUGH    Staphylococcus aureus NOT DETECTED NOT DETECTED Final   Methicillin resistance DETECTED (A) NOT DETECTED Final    Comment: CRITICAL RESULT CALLED TO, READ BACK BY AND VERIFIED WITH: J. MARKLE, PHARM AT 0800 ON [id number] BY S. YARBROUGH    Streptococcus species NOT DETECTED NOT DETECTED Final   Streptococcus agalactiae NOT DETECTED NOT DETECTED Final   Streptococcus pneumoniae NOT DETECTED NOT DETECTED Final   Streptococcus pyogenes NOT DETECTED NOT DETECTED Final   Acinetobacter baumannii NOT DETECTED NOT DETECTED Final   Enterobacteriaceae species DETECTED (A) NOT DETECTED Final    Comment: CRITICAL RESULT CALLED TO, READ BACK BY AND VERIFIED WITH: J. MARKLE, PHARM AT 0800 ON  [id number] BY S. YARBROUGH    Enterobacter cloacae complex NOT DETECTED NOT DETECTED Final   Escherichia coli NOT DETECTED NOT DETECTED Final   Klebsiella oxytoca NOT DETECTED NOT DETECTED Final   Klebsiella pneumoniae NOT DETECTED NOT DETECTED Final   Proteus species NOT DETECTED NOT DETECTED Final   Serratia marcescens NOT DETECTED NOT DETECTED Final   Carbapenem resistance NOT DETECTED NOT DETECTED Final   Haemophilus influenzae NOT DETECTED NOT DETECTED Final   Neisseria meningitidis NOT DETECTED NOT DETECTED Final   Pseudomonas aeruginosa NOT DETECTED NOT DETECTED Final   Candida albicans NOT DETECTED NOT DETECTED Final   Candida glabrata NOT DETECTED NOT DETECTED Final   Candida krusei NOT DETECTED NOT DETECTED Final   Candida parapsilosis NOT DETECTED NOT DETECTED Final   Candida tropicalis NOT DETECTED NOT DETECTED Final    Anti-infectives:  Anti-infectives    Start  Dose/Rate Route Frequency Ordered Stop   03/04/16 2000  ceFEPIme (MAXIPIME) 2 g in dextrose 5 % 50 mL IVPB     2 g 100 mL/hr over 30 Minutes Intravenous Every 24 hours 03/04/16 0852     03/03/16 2030  vancomycin (VANCOCIN) IVPB 750 mg/150 ml premix  Status:  Discontinued     750 mg 150 mL/hr over 60 Minutes Intravenous Every 12 hours 03/03/16 0829 03/04/16 1022   03/03/16 1630  ceFEPIme (MAXIPIME) 1 g in dextrose 5 % 50 mL IVPB  Status:  Discontinued     1 g 100 mL/hr over 30 Minutes Intravenous Every 8 hours 03/03/16 0829 03/04/16 0852   03/03/16 0900  vancomycin (VANCOCIN) 1,250 mg in sodium chloride 0.9 % 250 mL IVPB     1,250 mg 166.7 mL/hr over 90 Minutes Intravenous  Once 03/03/16 0823 03/03/16 1047   03/03/16 0900  ceFEPIme (MAXIPIME) 2 g in dextrose 5 % 50 mL IVPB     2 g 100 mL/hr over 30 Minutes Intravenous  Once 03/03/16 0823 03/03/16 1122   03/15/2016 1415  cefOXitin (MEFOXIN) 1 g in dextrose 5 % 50 mL IVPB  Status:  Discontinued     1 g 100 mL/hr over 30 Minutes Intravenous Every 6 hours 02/29/2016  1410 03/15/2016 1546      Best Practice/Protocols:  VTE Prophylaxis: Lovenox (prophylaxtic dose) and Mechanical GI Prophylaxis: Proton Pump Inhibitor Continous Sedation Currently off sedation for possible extubation  Consults: Treatment Team:  Toni Arthurs, MD [facility], MD    Events:  Subjective:    Overnight Issues: Did well overnight.  Has been weaning this morning  Objective:  Vital signs for last 24 hours: Temp:  [98.4 F (36.9 C)-100.1 F (37.8 C)] 99 F (37.2 C) (09/14 0800) Pulse Rate:  [82-108] 108 (09/14 1000) Resp:  [18-34] 32 (09/14 1000) BP: (96-172)/(43-73) 155/51 (09/14 1000) SpO2:  [100 %] 100 % (09/14 1000) FiO2 (%):  [30 %-40 %] 30 % (09/14 0726)  Hemodynamic parameters for last 24 hours:    Intake/Output from previous day: 09/13 0701 - 09/14 0700 In: 3024.8 [I.V.:1650; NG/GT:1124.8; IV Piggyback:250] Out: 1540 [Urine:1540]  Intake/Output this shift: Total I/O In: 1130 [P.O.:800; I.V.:225; NG/GT:105] Out: 200 [Urine:200]  Vent settings for last 24 hours: Vent Mode: PSV;CPAP FiO2 (%):  [30 %-40 %] 30 % Set Rate:  [14 bmp] 14 bmp Vt Set:  [460 mL] 460 mL PEEP:  [5 cmH20] 5 cmH20 Pressure Support:  [5 cmH20-8 cmH20] 5 cmH20 Plateau Pressure:  [15 cmH20-16 cmH20] 16 cmH20  Physical Exam:  General: alert, no respiratory distress and Mildly tachypneic Neuro: alert, oriented, weakness right upper extremity and weakness right lower extremity Resp: clear to auscultation bilaterally CVS: regular rate and rhythm, S1, S2 normal, no murmur, click, rub or gallop GI: soft, nontender, BS WNL, no r/g Extremities: edema 2+  Results for orders placed or performed during the hospital encounter of 03/21/2016 (from the past 24 hour(s))  Glucose, capillary     Status: Abnormal   Collection Time: 03/05/16 11:16 AM  Result Value Ref Range   Glucose-Capillary 153 (H) 65 - 99 mg/dL  Glucose, capillary     Status: Abnormal   Collection Time: 03/05/16   3:44 PM  Result Value Ref Range   Glucose-Capillary 153 (H) 65 - 99 mg/dL  Glucose, capillary     Status: Abnormal   Collection Time: 03/05/16  7:09 PM  Result Value Ref Range   Glucose-Capillary 171 (H) 65 -  99 mg/dL  Glucose, capillary     Status: Abnormal   Collection Time: 03/05/16 11:27 PM  Result Value Ref Range   Glucose-Capillary 230 (H) 65 - 99 mg/dL  Glucose, capillary     Status: Abnormal   Collection Time: 03/06/16  3:04 AM  Result Value Ref Range   Glucose-Capillary 209 (H) 65 - 99 mg/dL  Basic metabolic panel     Status: Abnormal   Collection Time: 03/06/16  4:39 AM  Result Value Ref Range   Sodium 157 (H) 135 - 145 mmol/L   Potassium 3.6 3.5 - 5.1 mmol/L   Chloride 129 (H) 101 - 111 mmol/L   CO2 25 22 - 32 mmol/L   Glucose, Bld 223 (H) 65 - 99 mg/dL   BUN 54 (H) 6 - 20 mg/dL   Creatinine, Ser 5.36 (H) 0.44 - 1.00 mg/dL   Calcium 7.5 (L) 8.9 - 10.3 mg/dL   GFR calc non Af Amer 49 (L) >60 mL/min   GFR calc Af Amer 57 (L) >60 mL/min   Anion gap 3 (L) 5 - 15  CBC with Differential/Platelet     Status: Abnormal   Collection Time: 03/06/16  4:39 AM  Result Value Ref Range   WBC 19.1 (H) 4.0 - 10.5 K/uL   RBC 2.56 (L) 3.87 - 5.11 MIL/uL   Hemoglobin 7.5 (L) 12.0 - 15.0 g/dL   HCT 64.4 (L) 03.4 - 74.2 %   MCV 95.3 78.0 - 100.0 fL   MCH 29.3 26.0 - 34.0 pg   MCHC 30.7 30.0 - 36.0 g/dL   RDW 59.5 (H) 63.8 - 75.6 %   Platelets 324 150 - 400 K/uL   Neutrophils Relative % 84 %   Lymphocytes Relative 12 %   Monocytes Relative 4 %   Eosinophils Relative 0 %   Basophils Relative 0 %   Neutro Abs 16.0 (H) 1.7 - 7.7 K/uL   Lymphs Abs 2.3 0.7 - 4.0 K/uL   Monocytes Absolute 0.8 0.1 - 1.0 K/uL   Eosinophils Absolute 0.0 0.0 - 0.7 K/uL   Basophils Absolute 0.0 0.0 - 0.1 K/uL   WBC Morphology TOXIC GRANULATION   Glucose, capillary     Status: Abnormal   Collection Time: 03/06/16  7:44 AM  Result Value Ref Range   Glucose-Capillary 196 (H) 65 - 99 mg/dL   Comment 1  Notify RN    Comment 2 Document in Chart      Assessment/Plan:   NEURO  Altered Mental Status:  agitation and sedation   Plan: Weaning sedation for extubation  PULM  Atelectasis/collapse (focal and bibasilar)   Plan: Will be very aggressive with pulmonoary toilet after extubation  CARDIO  No specific issues   Plan: CPM  RENAL  Urine output is good and renal function is improving.   Plan: CPM  GI  Bowel Trauma with perforation and resection.   Plan: Keep NGT after extubation and use for feeding or pull if the patient is able to eat on her own.  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  Platelets are up  ENDO No significant issues   Plan: CPM  Global Issues  We are going to try to extubate the patient who is very alert and pulling Vt 400-500.  Discussed in detail with the son at the bedside  Will do everything we can to keep her extubated     LOS: 9 days   Additional comments:I reviewed the patient's new clinical lab test results. cbc/bmet  Critical Care Total Time*: 45 Minutes  Ethne Jeon 03/06/2016  *Care during the described time interval was provided by me and/or other providers on the critical care team.  I have reviewed this patient's available data, including medical history, events of note, physical examination and test results as part of my evaluation.

## 2016-03-06 NOTE — Progress Notes (Signed)
Speech Language Pathology  Patient Details Name: Tabitha Bryant MRN: [id number] DOB: [birth year] Today's Date: 03/06/2016 Time:  -      Order received for swallow and cog eval. Pt extubated 12:13 after prolonged intubation. Will initiate assessments next date.        Breck CoonsLisa Willis [place] Washington[place] M.Ed CCC-SLP Pager [phone]  03/06/2016, 1:51 PM

## 2016-03-06 NOTE — Progress Notes (Signed)
RT note: Patient rhonchus and gurgling again. NTS patient obtaining small amount of secretions. Patient still in obvious distress. RN gave pain med. MD made aware and BIPAP ordered to be started on patient.

## 2016-03-06 NOTE — Progress Notes (Signed)
RT NOTE: Patient unable to clear secretions. MD inserted nasal trumphet and patient NTS by RT and MD obtaining small amount of secretions. Gurgling sound stopped, however, patient now sounds stridorous. Racemic treatment given by this RT. Sats remained stable. Patient placed back on 35% ventimask post treatment. Will continue to monitor.

## 2016-03-07 ENCOUNTER — Inpatient Hospital Stay (HOSPITAL_COMMUNITY): Payer: Medicare Other

## 2016-03-07 DIAGNOSIS — S2231XA Fracture of one rib, right side, initial encounter for closed fracture: Secondary | ICD-10-CM

## 2016-03-07 DIAGNOSIS — S298XXA Other specified injuries of thorax, initial encounter: Secondary | ICD-10-CM

## 2016-03-07 LAB — GLUCOSE, CAPILLARY
GLUCOSE-CAPILLARY: 119 mg/dL — AB (ref 65–99)
GLUCOSE-CAPILLARY: 148 mg/dL — AB (ref 65–99)
Glucose-Capillary: 149 mg/dL — ABNORMAL HIGH (ref 65–99)
Glucose-Capillary: 133 mg/dL — ABNORMAL HIGH (ref 65–99)
Glucose-Capillary: 105 mg/dL — ABNORMAL HIGH (ref 65–99)
Glucose-Capillary: 117 mg/dL — ABNORMAL HIGH (ref 65–99)

## 2016-03-07 LAB — BASIC METABOLIC PANEL
Anion gap: 6 (ref 5–15)
BUN: 43 mg/dL — AB (ref 6–20)
CALCIUM: 7.7 mg/dL — AB (ref 8.9–10.3)
CO2: 26 mmol/L (ref 22–32)
CREATININE: 0.85 mg/dL (ref 0.44–1.00)
Chloride: 125 mmol/L — ABNORMAL HIGH (ref 101–111)
GFR calc Af Amer: >60 mL/min (ref >60)
GLUCOSE: 164 mg/dL — AB (ref 65–99)
Potassium: 3.4 mmol/L — ABNORMAL LOW (ref 3.5–5.1)
SODIUM: 157 mmol/L — AB (ref 135–145)

## 2016-03-07 MED ORDER — DEXTROSE 5 % IV SOLN
2.0000 g | Freq: Two times a day (BID) | INTRAVENOUS | Status: DC
  Administered 2016-03-07 (×2): 2 g via INTRAVENOUS
  Filled 2016-03-07 (×4): qty 2

## 2016-03-07 MED ORDER — FENTANYL CITRATE (PF) 100 MCG/2ML IJ SOLN
25.0000 ug | INTRAMUSCULAR | Status: DC | PRN
  Administered 2016-03-07 (×2): 50 ug via INTRAVENOUS
  Filled 2016-03-07: qty 2

## 2016-03-07 MED ORDER — FENTANYL BOLUS VIA INFUSION
50.0000 ug | INTRAVENOUS | Status: DC | PRN
  Filled 2016-03-07: qty 50

## 2016-03-07 MED ORDER — ORAL CARE MOUTH RINSE
15.0000 mL | OROMUCOSAL | Status: DC
  Administered 2016-03-07 – 2016-03-08 (×3): 15 mL via OROMUCOSAL

## 2016-03-07 MED ORDER — MORPHINE SULFATE (PF) 2 MG/ML IV SOLN
1.0000 mg | INTRAVENOUS | Status: DC | PRN
  Administered 2016-03-07 – 2016-03-08 (×5): 1 mg via INTRAVENOUS
  Filled 2016-03-07 (×6): qty 1

## 2016-03-07 MED ORDER — POTASSIUM CHLORIDE 10 MEQ/50ML IV SOLN
10.0000 meq | INTRAVENOUS | Status: DC | PRN
  Administered 2016-03-07 (×2): 10 meq via INTRAVENOUS
  Filled 2016-03-07 (×3): qty 50

## 2016-03-07 MED ORDER — FREE WATER: 200.0000 mL | Freq: Three times a day (TID) | Status: DC

## 2016-03-07 MED ORDER — FREE WATER
300.0000 mL | Freq: Four times a day (QID) | Status: DC
  Administered 2016-03-07 – 2016-03-08 (×4): 300 mL

## 2016-03-07 NOTE — Progress Notes (Signed)
Pharmacy Antibiotic Note  Tabitha Bryant is a 82 y.o. female admitted on 03/04/2016 with CONS bacteremia 1/2 BCx - Day #5.  Pharmacy has been consulted for cefepime dosing. Renal function improving, CrCl~53. Afebrile, wbc stable 19.3. Noted PCN allergy listed but has tolerated cephalosporins.  Plan: Increase cefepime to 2g IV q12h for improved renal function Monitor culture data, renal function and clinical course De-escalate?  Height: 5' 6.5" (168.9 cm) Weight: 192 lb 10.9 oz (87.4 kg) IBW/kg (Calculated) : 60.45  Temp (24hrs), Avg:98.9 F (37.2 C), Min:97.8 F (36.6 C), Max:99.3 F (37.4 C)   Recent Labs Lab 03/01/16 2308 03/04/16 0500 03/05/16 0445 03/06/16 0439 03/06/16 1700 03/07/16 0500  WBC 12.3* 22.4* 19.6* 19.1* 19.3*  --   CREATININE 0.90 1.20* 1.09* 1.01*  --  0.85    Estimated Creatinine Clearance: 53.5 mL/min (by C-G formula based on SCr of 0.85 mg/dL).    Allergies  Allergen Reactions  . Penicillins Other (See Comments)    Unknown Tolerated cefoxitin 02/27/16    Antimicrobials this admission: Cefepime 9/11 >>  Vanc 9/11 >> 9/12  Dose adjustments this admission: N/a  Microbiology results: 9/10 BCID: MRSE, +enterobacteriaceae 9/10 BCx: CONS 1/2, other pending 9/10 UCx: neg  9/5 MRSA PCR: neg  Babs BertinHaley Iyahna Obriant, PharmD, BCPS Clinical Pharmacist 03/07/2016 8:58 AM

## 2016-03-07 NOTE — Progress Notes (Signed)
Patient NTS at this time obtaining no secretions despite the gurgling sound. Sats dropped into the 80's. Placing patient back on BiPAP at this time. RN at bedside.

## 2016-03-07 NOTE — Progress Notes (Signed)
Patient placed on 40% ventimask to give her a break off of the BiPAP. Will monitor.

## 2016-03-07 NOTE — Progress Notes (Signed)
OT Cancellation Note  Patient Details Name: Tabitha HopesVertie P Bryant MRN: [id number] DOB: Dec 20, 1927   Cancelled Treatment:    Reason Eval/Treat Not Completed: Patient not medically ready Rn requesting therapy hold at this time.   Felecia ShellingJones, Shawnell Dykes B   Dalen [place], Brynn   OTR/L Pager: [phone] Office: [phone] .  03/07/2016, 9:40 AM

## 2016-03-07 NOTE — Care Management Important Message (Signed)
Important Message  Patient Details  Name: Tabitha Bryant MRN: [id number] Date of Birth: [birth year]   Medicare Important Message Given:  Yes    [name] Abena 03/07/2016, 11:41 AM
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
## 2016-03-07 NOTE — Progress Notes (Signed)
SLP Cancellation Note  Patient Details Name: Tabitha Bryant MRN: [id number] DOB: 03-07-1928   Cancelled treatment:        Unable to perform swallow or speech-cognitive assessment due to pt on Bi-Pap with labored breathing. Will follow along.    Royce MacadamiaLitaker, Kerstyn Coryell Willis 03/07/2016, 10:47 AM   Breck CoonsLisa Willis Lonell FaceLitaker M.Ed [employer]-SLP Pager [phone]

## 2016-03-07 NOTE — Progress Notes (Signed)
Patient was taken off BIPAP for a break. Attempted NTS and received scant Romani Wilbon,clear secretions. Placed on NRB at 15 L, patient was off for about 10 mins. Patient did not tolerate, elevated BP, increased WOB and distress. Patient placed back on BIPAP. RN at bedside.
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
## 2016-03-07 NOTE — Progress Notes (Signed)
Spoke with pt's son in re: to his mother's resp status.  We discussed that at this time she is stable on BiPap, but will likely fatigue based on her current work of breathing.  He states he would not want her reintubated if she did fatigue and tired out. Both his nurse and RT were present at this conversation.

## 2016-03-07 NOTE — Progress Notes (Signed)
PT Cancellation Note  Patient Details Name: Birdie HopesVertie P Holberg MRN: [id number] DOB: [birth year]   Cancelled Treatment:    Reason Eval/Treat Not Completed: Patient not medically ready, patient on BiPap with increased WOB, per nsg will hold today  Fabio AsaWerner, Michaiah Holsopple J 03/07/2016, 9:26 AM Charlotte Crumbevon Bayle Calvo, PT DPT  [phone]

## 2016-03-07 NOTE — Progress Notes (Signed)
After placing patient back on BIPAP patient Sat began to drop to 89% increased FIO2  To 60%. Patient tolerating at this time will reassess.

## 2016-03-08 ENCOUNTER — Inpatient Hospital Stay (HOSPITAL_COMMUNITY): Payer: Medicare Other

## 2016-03-08 DIAGNOSIS — S3991XA Unspecified injury of abdomen, initial encounter: Secondary | ICD-10-CM

## 2016-03-08 DIAGNOSIS — S3981XA Other specified injuries of abdomen, initial encounter: Secondary | ICD-10-CM

## 2016-03-08 LAB — CBC
HEMATOCRIT: 24.5 % — AB (ref 36.0–46.0)
Hemoglobin: 7.3 g/dL — ABNORMAL LOW (ref 12.0–15.0)
MCH: 29.1 pg (ref 26.0–34.0)
MCHC: 29.8 g/dL — ABNORMAL LOW (ref 30.0–36.0)
MCV: 97.6 fL (ref 78.0–100.0)
Platelets: 374 10*3/uL (ref 150–400)
RBC: 2.51 MIL/uL — AB (ref 3.87–5.11)
RDW: 17.6 % — ABNORMAL HIGH (ref 11.5–15.5)
WBC: 15.3 10*3/uL — AB (ref 4.0–10.5)

## 2016-03-08 LAB — BASIC METABOLIC PANEL
ANION GAP: 5 (ref 5–15)
BUN: 36 mg/dL — ABNORMAL HIGH (ref 6–20)
CO2: 26 mmol/L (ref 22–32)
Calcium: 7.9 mg/dL — ABNORMAL LOW (ref 8.9–10.3)
Chloride: 125 mmol/L — ABNORMAL HIGH (ref 101–111)
Creatinine, Ser: 0.76 mg/dL (ref 0.44–1.00)
GFR calc Af Amer: >60 mL/min (ref >60)
GFR calc non Af Amer: >60 mL/min (ref >60)
GLUCOSE: 109 mg/dL — AB (ref 65–99)
POTASSIUM: 3.6 mmol/L (ref 3.5–5.1)
Sodium: 156 mmol/L — ABNORMAL HIGH (ref 135–145)

## 2016-03-08 LAB — GLUCOSE, CAPILLARY
Glucose-Capillary: 108 mg/dL — ABNORMAL HIGH (ref 65–99)
Glucose-Capillary: 109 mg/dL — ABNORMAL HIGH (ref 65–99)

## 2016-03-08 MED ORDER — MORPHINE BOLUS VIA INFUSION
5.0000 mg | INTRAVENOUS | Status: DC | PRN
  Filled 2016-03-08: qty 20

## 2016-03-08 MED ORDER — MORPHINE SULFATE 25 MG/ML IV SOLN
10.0000 mg/h | INTRAVENOUS | Status: DC
  Administered 2016-03-08: 10 mg/h via INTRAVENOUS
  Filled 2016-03-08: qty 10

## 2016-03-10 LAB — CULTURE, BLOOD (ROUTINE X 2)
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
## 2016-03-23 NOTE — Progress Notes (Signed)
PT Cancellation/Discharge Note  Patient Details Name: Tabitha Bryant MRN: [id number] DOB: [birth year]   Cancelled Treatment:    Reason Eval/Treat Not Completed: Medical issues which prohibited therapy. Patient is now DNR/DNI and on comfort care.  PT will sign off at this time.  Please reorder PT consult if patient's condition improves.  Thank you.   Vena AustriaDavis, Briana Farner H Aug 23, 2015, 11:43 AM Durenda HurtSusan H. Renaldo Fiddleravis, PT, [facility]ingM[facility] Pager [phone]
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
## 2016-03-23 NOTE — Progress Notes (Signed)
Pt made comfort care by family this morning. Morphine gtt started and BiPap removed. Family did not wish to be present in room and waiting in waiting area. Pt pronounced dead at 1241 by myself and J. Daleen Squibbhomasson, RN- no heartbeat heard by either person. Family, ME, CDS, and MDs notified of patient death. CDS ruled out. ME will be in tomorrow morning for pt.  Morphine gtt wasted, witnessed by M. Lara MulchBergmann, RN, 225cc in sink.

## 2016-03-23 NOTE — Progress Notes (Signed)
Patient removed from BiPAP for comfort care. RT available as needed.

## 2016-03-23 DEATH — deceased
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
## 2017-09-30 IMAGING — CT CT HEAD CODE STROKE
4 series · 15 of 47 positions shown, 17 images · non-contrast
Comparison: Prior study from 02/26/2016.

CLINICAL DATA: Code stroke.

EXAM:
CT HEAD WITHOUT CONTRAST
TECHNIQUE: Contiguous axial images were obtained from the base of the skull
through the vertex without intravenous contrast.

[Series 2: head 5.0 st · axial · 0.44mm/px · z∈[+591,+711]mm · 7 of 33 slices shown, 9 images]
[im 5/33  brain]
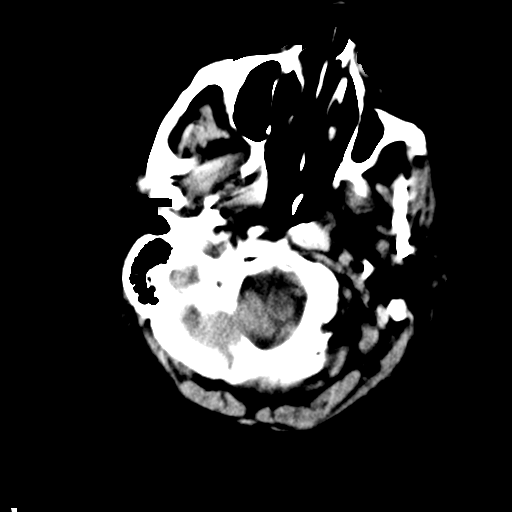
[im 5/33  bone]
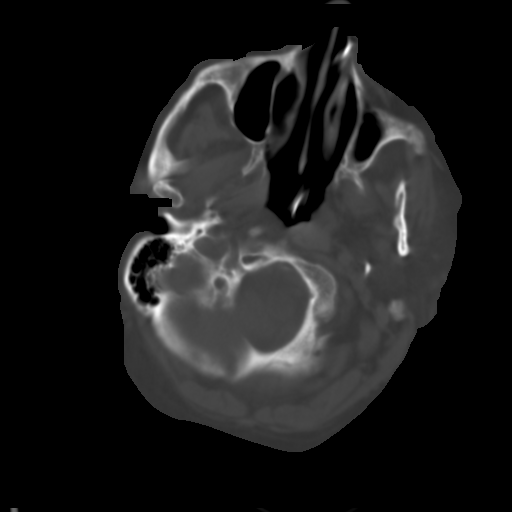
[im 9/33  brain]
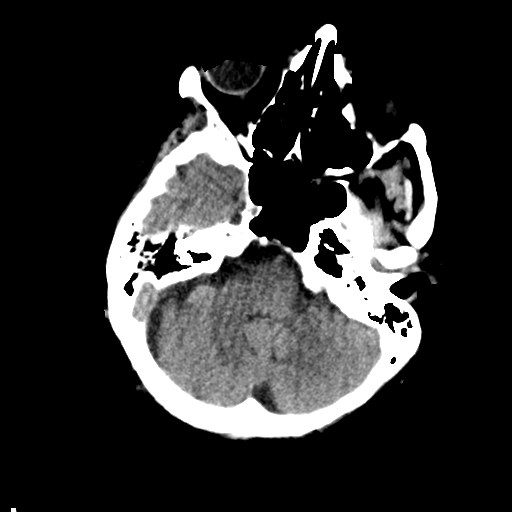
[im 13/33  brain]
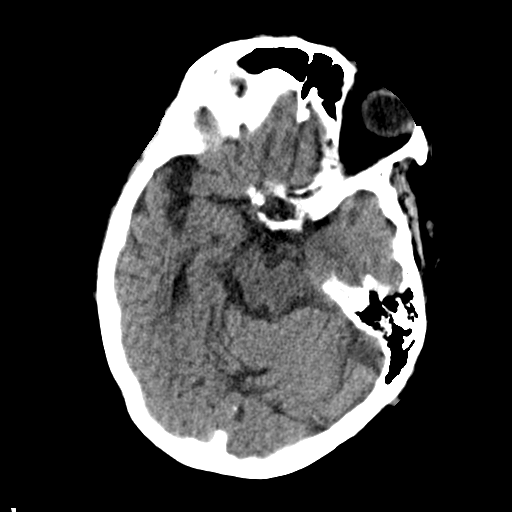
[im 17/33  brain]
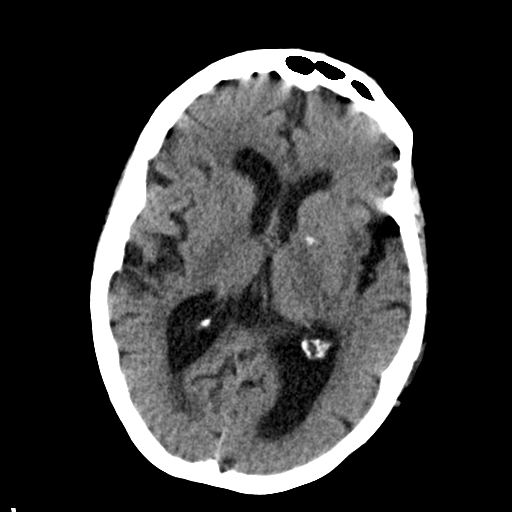
[im 21/33  brain]
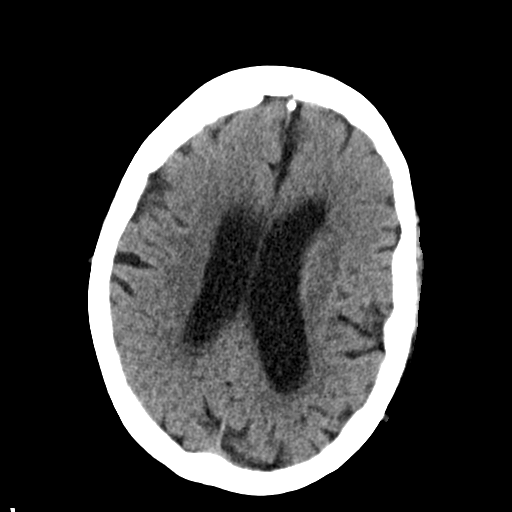
[im 21/33  bone]
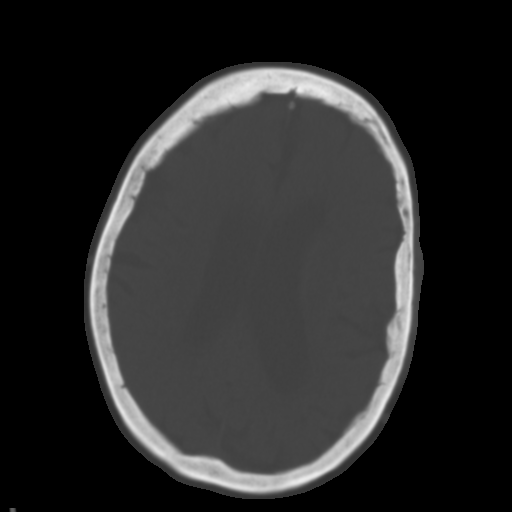
[im 25/33  brain]
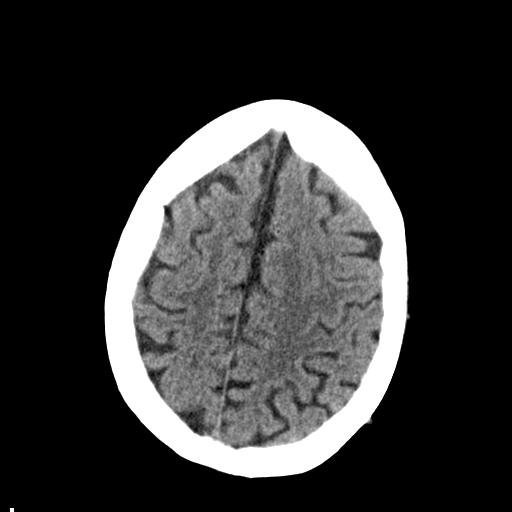
[im 29/33  brain]
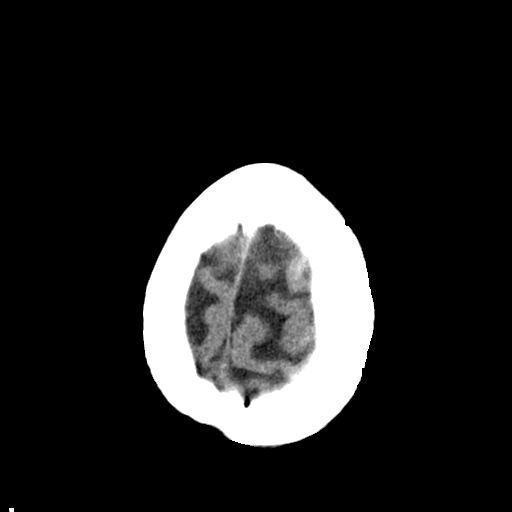

[Series 4: head 3.0 cor st · coronal · 0.31mm/px · 3 of 72 slices shown]
[im 27/72  brain]
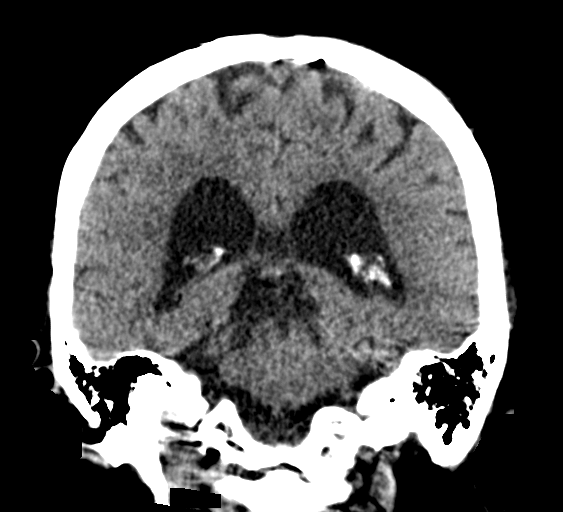
[im 33/72  brain]
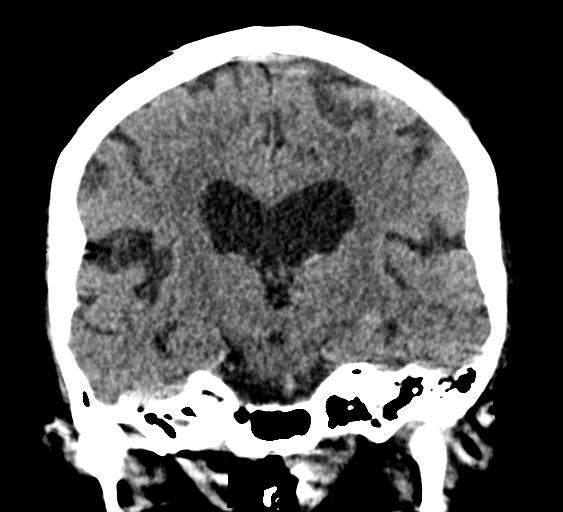
[im 39/72  brain]
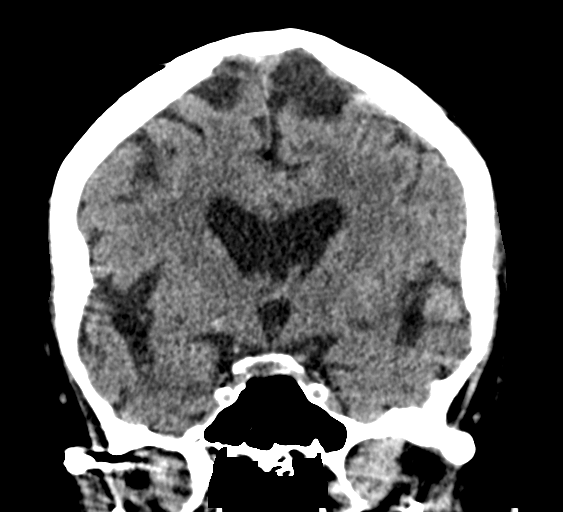

[Series 5: head 3.0 sag st · sagittal · 0.31mm/px · 3 of 51 slices shown]
[im 17/51  brain]
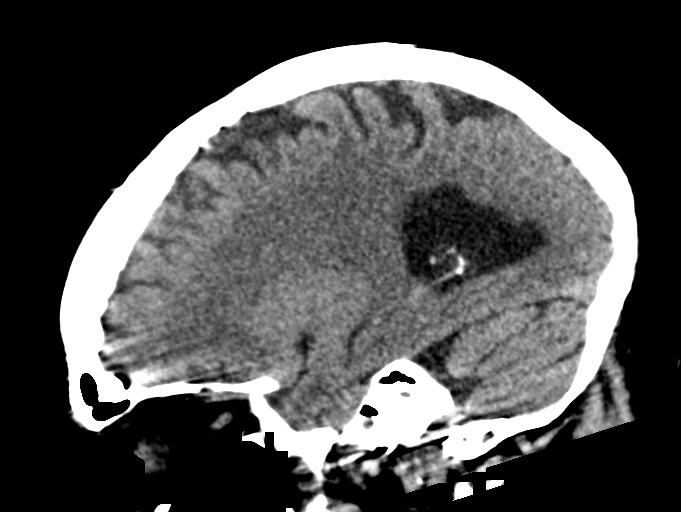
[im 26/51  brain]
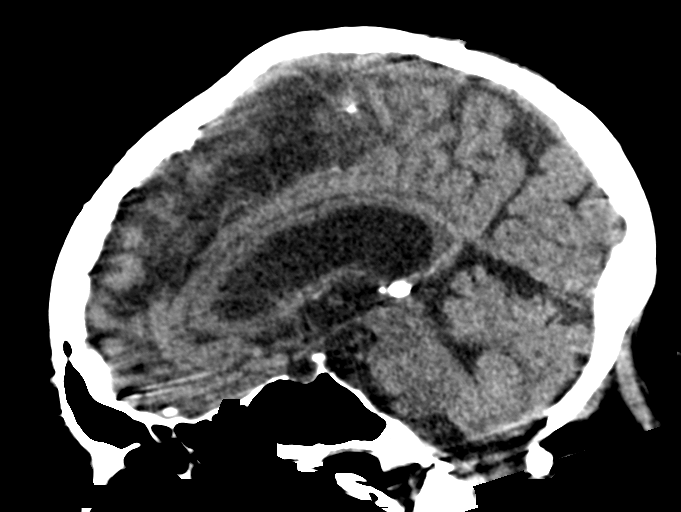
[im 34/51  brain]
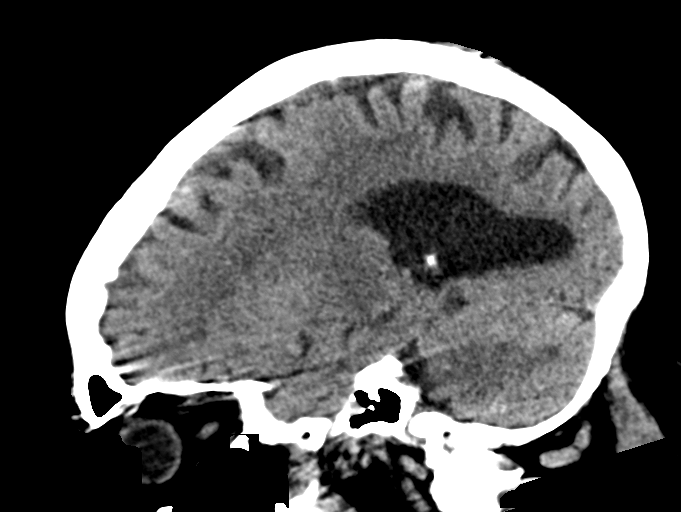

[Series 6: head ax 2mme · axial · 0.35mm/px · z∈[+609,+624]mm · 2 of 80 slices shown]
[im 8/80  brain]
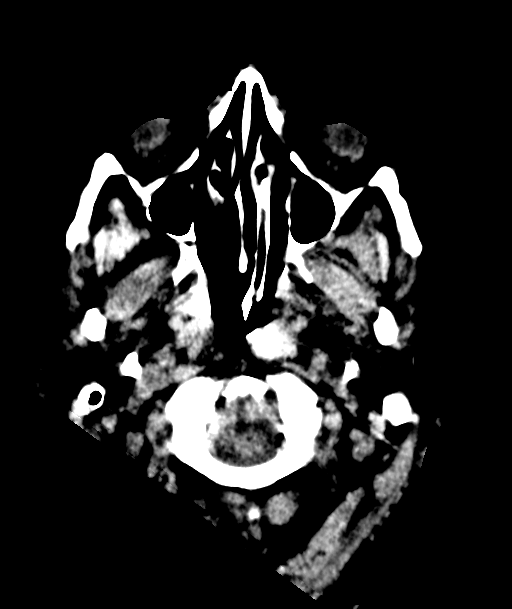
[im 16/80  brain]
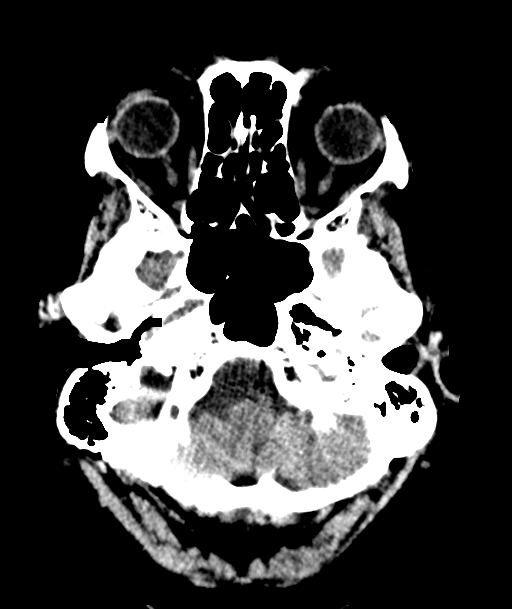

[15 of 47 positions shown; findings below may reference images not displayed]

FINDINGS: Study mildly degraded by motion artifact.

Diffuse age-related cerebral atrophy with mild chronic microvascular
ischemic disease again noted. Small remote left cerebellar infarct.
No mass lesion, midline shift or mass effect. No hydrocephalus. No
extra-axial fluid collection.

No definite acute large vessel territory infarct. There is vague
hypodensity extending through the left frontal temporal region at
the level of the insula (series 6, image 32), favored to be
artifactual in nature as there is motion through this region on this
exam. Deep gray nuclei maintained. No definite evolving large vessel
territory infarct.

Prominent vascular calcifications present within the carotid
siphons. No hyperdense vessel.

Scalp soft tissues demonstrate no acute abnormality. No acute
abnormality about the globes and orbits. Patient is status post lens
extraction bilaterally.

Mild scattered mucosal thickening within the paranasal sinuses.
Nasogastric tube in place. Small left mastoid effusion noted.

Calvarium intact.

ASPECTS (Alberta Stroke Program Early CT Score)

- Ganglionic level infarction (caudate, lentiform nuclei, internal
capsule, insula, M1-M3 cortex): 7

- Supraganglionic infarction (M4-M6 cortex): 3

Total score (0-10 with 10 being normal): 10
IMPRESSION: 1. No definite acute intracranial infarct or other process
identified. Vague hypodensity involving the left frontotemporal
region at the level of the insula favored to be artifactual in
nature due to motion/beam hardening artifact on this exam.
2. ASPECTS is 10 (see above discussion).
3. Stable atrophy with chronic microvascular ischemic disease.

Critical Value/emergent results were called by telephone at the time
of interpretation on 02/28/2016 at [DATE] to Dr. Shawana, who
verbally acknowledged these results.
